# Patient Record
Sex: Female | Born: 1965 | Race: Black or African American | Hispanic: No | Marital: Married | Smoking: Never smoker
Health system: Southern US, Community
[De-identification: ages and names within clinical notes are randomized; demographics above are authoritative.]

## PROBLEM LIST (undated history)

## (undated) DIAGNOSIS — J309 Allergic rhinitis, unspecified: Secondary | ICD-10-CM

## (undated) DIAGNOSIS — G43909 Migraine, unspecified, not intractable, without status migrainosus: Secondary | ICD-10-CM

## (undated) DIAGNOSIS — K219 Gastro-esophageal reflux disease without esophagitis: Secondary | ICD-10-CM

## (undated) HISTORY — DX: Gastro-esophageal reflux disease without esophagitis: K21.9

---

## 2006-05-27 HISTORY — PX: ABDOMINAL HYSTERECTOMY: SHX81

## 2008-12-20 ENCOUNTER — Emergency Department (HOSPITAL_COMMUNITY): Admission: EM | Admit: 2008-12-20 | Discharge: 2008-12-20 | Payer: Self-pay | Admitting: Family Medicine

## 2010-09-27 ENCOUNTER — Other Ambulatory Visit: Payer: Self-pay | Admitting: Family Medicine

## 2010-09-27 DIAGNOSIS — Z1231 Encounter for screening mammogram for malignant neoplasm of breast: Secondary | ICD-10-CM

## 2010-10-04 ENCOUNTER — Ambulatory Visit
Admission: RE | Admit: 2010-10-04 | Discharge: 2010-10-04 | Disposition: A | Discharge status: Home / Self Care | Payer: Commercial Managed Care - PPO | Source: Ambulatory Visit | Attending: Family Medicine | Admitting: Family Medicine

## 2010-10-04 DIAGNOSIS — Z1231 Encounter for screening mammogram for malignant neoplasm of breast: Secondary | ICD-10-CM

## 2011-11-07 ENCOUNTER — Encounter (HOSPITAL_COMMUNITY): Payer: Self-pay | Admitting: Emergency Medicine

## 2011-11-07 ENCOUNTER — Emergency Department (HOSPITAL_COMMUNITY)
Admission: EM | Admit: 2011-11-07 | Discharge: 2011-11-07 | Disposition: A | Discharge status: Home / Self Care | Payer: Commercial Managed Care - PPO | Source: Home / Self Care | Attending: Family Medicine | Admitting: Family Medicine

## 2011-11-07 DIAGNOSIS — S61211A Laceration without foreign body of left index finger without damage to nail, initial encounter: Secondary | ICD-10-CM

## 2011-11-07 DIAGNOSIS — S61209A Unspecified open wound of unspecified finger without damage to nail, initial encounter: Secondary | ICD-10-CM

## 2011-11-07 HISTORY — DX: Migraine, unspecified, not intractable, without status migrainosus: G43.909

## 2011-11-07 MED ORDER — BACITRACIN 500 UNIT/GM EX OINT
1.0000 "application " | TOPICAL_OINTMENT | Freq: Once | CUTANEOUS | Status: AC
  Administered 2011-11-07: 1 via TOPICAL

## 2011-11-07 NOTE — ED Provider Notes (Signed)
History     CSN: [id number]  Arrival date & time 11/07/11  1054   First MD Initiated Contact with Patient 11/07/11 1111      Chief Complaint  Patient presents with  . Laceration    (Consider location/radiation/quality/duration/timing/severity/associated sxs/prior treatment) HPI Comments: The patient presents with a small laceration to her left index finger. States she was washing dishes and a bowl broke in her hands. Bleeding controlled. Laceration is approx 2 cm. No numbness or itingling. Dt up to date.   The history is provided by the patient and the spouse.    Past Medical History  Diagnosis Date  . Migraines     Past Surgical History  Procedure Date  . Abdominal hysterectomy     History reviewed. No pertinent family history.  History  Substance Use Topics  . Smoking status: Never Smoker   . Smokeless tobacco: Not on file  . Alcohol Use: No    OB History    Grav Para Term Preterm Abortions TAB SAB Ect Mult Living                  Review of Systems  Constitutional: Negative.   HENT: Negative.   Respiratory: Negative.   Cardiovascular: Negative.     Allergies  Review of patient's allergies indicates no known allergies.  Home Medications  No current outpatient prescriptions on file.  BP 132/91  Pulse 85  Temp 98.4 F (36.9 C) (Oral)  Resp 16  SpO2 99%  Physical Exam  Nursing note and vitals reviewed. Constitutional: She appears well-developed and well-nourished. No distress.  HENT:  Head: Normocephalic.  Cardiovascular: Normal rate and regular rhythm.   Pulmonary/Chest: Effort normal and breath sounds normal.  Musculoskeletal:       The patient has a 2 cm full skin thickness laceration to the medial aspect of his left index finger proximal to the pip joint. N/v intact. Full rom. Bleeding controlled.     ED Course  Procedures (including critical care time)the wound was cleansed with betadine. Local with 2% xylocain. Sterile drape and  technique. Wound explored and no fb found. Closed with 5-0 Ethilon , 2 simple sutures. Good closure and hemostasis. The patient tolerated well. Wound cleansed with sterile saline , antibiotic ointment and dressing applied.  Labs Reviewed - No data to display No results found.   1. Laceration of left index finger w/o foreign body w/o damage to nail       MDM          Randa Spike, MD 11/07/11 1213

## 2011-11-07 NOTE — Discharge Instructions (Signed)
Keep clean and covered. Ok to shower. Apply antibiotic ointment and cover. Follow up in 10 days for suture removal. Follow up sooner if any complications or concerns. May use mederma once healing complete

## 2011-11-07 NOTE — ED Notes (Signed)
Reports tetanus less than 5 years ago.

## 2011-11-07 NOTE — ED Notes (Signed)
Assisted with suture setup

## 2011-11-07 NOTE — ED Notes (Signed)
Laceration to left index finger.  Bleeding controlled at present.  Cut finger washing a glass bowl this am.

## 2011-11-17 ENCOUNTER — Emergency Department (HOSPITAL_COMMUNITY)
Admission: EM | Admit: 2011-11-17 | Discharge: 2011-11-17 | Discharge status: Absent without leave | Payer: Self-pay | Source: Home / Self Care

## 2011-12-30 ENCOUNTER — Other Ambulatory Visit: Payer: Self-pay | Admitting: Family Medicine

## 2011-12-30 DIAGNOSIS — Z1231 Encounter for screening mammogram for malignant neoplasm of breast: Secondary | ICD-10-CM

## 2012-01-06 ENCOUNTER — Ambulatory Visit
Admission: RE | Admit: 2012-01-06 | Discharge: 2012-01-06 | Disposition: A | Discharge status: Home / Self Care | Payer: Commercial Managed Care - PPO | Source: Ambulatory Visit | Attending: Family Medicine | Admitting: Family Medicine

## 2012-01-06 DIAGNOSIS — Z1231 Encounter for screening mammogram for malignant neoplasm of breast: Secondary | ICD-10-CM

## 2012-07-27 ENCOUNTER — Encounter: Payer: Self-pay | Admitting: *Deleted

## 2012-07-30 ENCOUNTER — Encounter: Payer: Self-pay | Admitting: Cardiovascular Disease

## 2012-07-30 ENCOUNTER — Ambulatory Visit (INDEPENDENT_AMBULATORY_CARE_PROVIDER_SITE_OTHER): Payer: Commercial Managed Care - PPO | Admitting: Cardiovascular Disease

## 2012-07-30 ENCOUNTER — Encounter: Payer: Self-pay | Admitting: *Deleted

## 2012-07-30 VITALS — BP 118/72 | HR 76 | Ht 64.0 in | Wt 158.8 lb

## 2012-07-30 DIAGNOSIS — F419 Anxiety disorder, unspecified: Secondary | ICD-10-CM | POA: Insufficient documentation

## 2012-07-30 DIAGNOSIS — R079 Chest pain, unspecified: Secondary | ICD-10-CM

## 2012-07-30 NOTE — Progress Notes (Signed)
Patient ID: Michele Snyder, female   DOB: 23-Jan-1966, 47 y.o.   MRN: [id number] 47 yo referred by Dr Dimas Aguas for chest pain Started in 2023/05/14 when her mother died. Had anxiety attacks.  Pain worse at night and with recumbancy.  Helped with prilosec.  Has tried to change diet. Occasional left sided pain with walking but mostly rest pain.  Anxiety improved but 49 yo son is living with her now going through a divorce with a child.  Compliant with meds and thinks H2 blocker and valium helping No previous history of cardiac issues.    ROS: Denies fever, malais, weight loss, blurry vision, decreased visual acuity, cough, sputum, SOB, hemoptysis, pleuritic pain, palpitaitons, heartburn, abdominal pain, melena, lower extremity edema, claudication, or rash.  All other systems reviewed and negative   General: Affect appropriate Healthy:  appears stated age HEENT: normal Neck supple with no adenopathy JVP normal no bruits no thyromegaly Lungs clear with no wheezing and good diaphragmatic motion Heart:  S1/S2 no murmur,rub, gallop or click PMI normal Abdomen: benighn, BS positve, no tenderness, no AAA no bruit.  No HSM or HJR Distal pulses intact with no bruits No edema Neuro non-focal Skin warm and dry No muscular weakness  Medications Current Outpatient Prescriptions  Medication Sig Dispense Refill  . ALPRAZolam (XANAX) 0.5 MG tablet Take 0.5 mg by mouth as needed for sleep.      Marland Kitchen omeprazole (PRILOSEC) 20 MG capsule Take 20 mg by mouth 2 (two) times daily.      . pseudoephedrine (SUDAFED) 120 MG 12 hr tablet Take 120 mg by mouth as needed for congestion.      . ranitidine (ZANTAC) 150 MG tablet Take 150 mg by mouth 2 (two) times daily.       No current facility-administered medications for this visit.    Allergies Review of patient's allergies indicates no known allergies.  Family History: No family history on file.  Social History: History   Social History  . Marital Status:  Married    Spouse Name: N/A    Number of Children: N/A  . Years of Education: N/A   Occupational History  . Not on file.   Social History Main Topics  . Smoking status: Never Smoker   . Smokeless tobacco: Not on file  . Alcohol Use: No  . Drug Use: No  . Sexually Active: Not on file   Other Topics Concern  . Not on file   Social History Narrative  . No narrative on file    Electrocardiogram:  NSR rate 99 normal ECG  Done 07/28/12 Dr Penni Bombard office  Assessment and Plan

## 2012-07-30 NOTE — Assessment & Plan Note (Signed)
Low carb diet and portion control discussed.  Continue proton pump inhibitor

## 2012-07-30 NOTE — Patient Instructions (Addendum)
Your physician recommends that you schedule a follow-up appointment in: WE WILL CALL YOU WITH RESULTS AND ADVISE NEXT STEPS  Your physician has requested that you have a stress echocardiogram. For further information please visit [email or web address] Please follow instruction sheet as given.

## 2012-07-30 NOTE — Assessment & Plan Note (Signed)
Atypical normal ECG  F/U stress echo

## 2012-07-30 NOTE — Assessment & Plan Note (Signed)
Reactive secondary to mother's death Improving PRN xanax

## 2012-08-13 ENCOUNTER — Other Ambulatory Visit (HOSPITAL_COMMUNITY): Payer: Commercial Managed Care - PPO

## 2012-08-13 ENCOUNTER — Ambulatory Visit (HOSPITAL_COMMUNITY)
Admission: RE | Admit: 2012-08-13 | Discharge: 2012-08-13 | Disposition: A | Discharge status: Home / Self Care | Payer: 59 | Source: Ambulatory Visit | Attending: Cardiovascular Disease | Admitting: Cardiovascular Disease

## 2012-08-13 DIAGNOSIS — R072 Precordial pain: Secondary | ICD-10-CM

## 2012-08-13 DIAGNOSIS — R079 Chest pain, unspecified: Secondary | ICD-10-CM | POA: Insufficient documentation

## 2012-08-13 NOTE — Progress Notes (Signed)
Stress Lab Nurses Notes - Kili Gracy 08/13/2012 Reason for doing test: Chest Pain Type of test: Stress Echo Nurse performing test: Parke Poisson, RN Nuclear Medicine Tech: Not Applicable Echo Tech: Karrie Doffing MD performing test: R. Rothbart & Joni Reining NP Family MD: Dr. Dimas Aguas Test explained and consent signed: yes IV started: No IV started Symptoms: Fatigue Treatment/Intervention: None Reason test stopped: fatigue and reached target HR After recovery IV was: NA Patient to return to Nuc. Med at : NA Patient discharged: Home Patient's Condition upon discharge was: stable Comments: During test peak BP 173/74 & HR 171.  Recovery BP 132/77 & HR 95.  Symptoms resolved in recovery. Erskine Speed T

## 2012-08-13 NOTE — Progress Notes (Signed)
*  PRELIMINARY RESULTS* Echocardiogram Echocardiogram Stress Test has been performed.  Conrad Railroad 08/13/2012, 10:19 AM

## 2012-08-17 ENCOUNTER — Telehealth: Payer: Self-pay | Admitting: Cardiovascular Disease

## 2012-08-17 NOTE — Telephone Encounter (Signed)
Pt rtn call 

## 2012-08-25 NOTE — Telephone Encounter (Signed)
OLD MESSAGE  SEE  STRESS ECHO RESULT NOTE .Zack Seal

## 2012-09-28 ENCOUNTER — Other Ambulatory Visit: Payer: Self-pay | Admitting: Occupational Medicine

## 2012-09-28 ENCOUNTER — Ambulatory Visit: Payer: Self-pay

## 2012-09-28 DIAGNOSIS — R52 Pain, unspecified: Secondary | ICD-10-CM

## 2012-12-08 ENCOUNTER — Encounter (HOSPITAL_COMMUNITY): Payer: Self-pay | Admitting: Cardiology

## 2013-03-15 ENCOUNTER — Other Ambulatory Visit: Payer: Self-pay

## 2013-03-15 DIAGNOSIS — Z1231 Encounter for screening mammogram for malignant neoplasm of breast: Secondary | ICD-10-CM

## 2013-04-12 ENCOUNTER — Ambulatory Visit: Payer: Self-pay

## 2013-05-03 ENCOUNTER — Ambulatory Visit: Payer: Self-pay

## 2013-05-19 ENCOUNTER — Ambulatory Visit
Admission: RE | Admit: 2013-05-19 | Discharge: 2013-05-19 | Disposition: A | Discharge status: Home / Self Care | Payer: 59 | Source: Ambulatory Visit

## 2013-05-19 DIAGNOSIS — Z1231 Encounter for screening mammogram for malignant neoplasm of breast: Secondary | ICD-10-CM

## 2013-11-29 ENCOUNTER — Ambulatory Visit (HOSPITAL_COMMUNITY)
Admission: RE | Admit: 2013-11-29 | Discharge: 2013-11-29 | Disposition: A | Discharge status: Home / Self Care | Payer: 59 | Source: Ambulatory Visit | Attending: Family Medicine | Admitting: Family Medicine

## 2013-11-29 ENCOUNTER — Other Ambulatory Visit (HOSPITAL_COMMUNITY): Payer: Self-pay | Admitting: Family Medicine

## 2013-11-29 DIAGNOSIS — N200 Calculus of kidney: Secondary | ICD-10-CM

## 2013-11-29 DIAGNOSIS — R319 Hematuria, unspecified: Secondary | ICD-10-CM

## 2013-11-29 DIAGNOSIS — R109 Unspecified abdominal pain: Secondary | ICD-10-CM | POA: Insufficient documentation

## 2013-11-29 NOTE — Progress Notes (Signed)
Phoned results and faxed results to Dr Selinda FlavinKevin Howard at 10:30 am . Pt was not in waiting room when phone call was transferred. This was relayed to the Physicians office and if pt was to return to Radiology, patient was to go home. The office was informed that the patient was not present and would inform the patient if she returned.

## 2014-04-28 ENCOUNTER — Emergency Department (HOSPITAL_COMMUNITY)
Admission: EM | Admit: 2014-04-28 | Discharge: 2014-04-28 | Disposition: A | Discharge status: Home / Self Care | Payer: 59 | Source: Home / Self Care | Attending: Emergency Medicine | Admitting: Emergency Medicine

## 2014-04-28 ENCOUNTER — Encounter (HOSPITAL_COMMUNITY): Payer: Self-pay | Admitting: *Deleted

## 2014-04-28 DIAGNOSIS — H1011 Acute atopic conjunctivitis, right eye: Secondary | ICD-10-CM

## 2014-04-28 MED ORDER — OLOPATADINE HCL 0.2 % OP SOLN: OPHTHALMIC | Status: DC

## 2014-04-28 MED ORDER — POLYMYXIN B-TRIMETHOPRIM 10000-0.1 UNIT/ML-% OP SOLN: 1.0000 [drp] | OPHTHALMIC | Status: DC

## 2014-04-28 NOTE — ED Notes (Signed)
Pt  Reports  Symptoms   Of         Of  Eye  Drainage   And   Matted   together        Since  yest   dens  any  Injury  denys  Any  Pain      The r  Eye is slightly  reddned   And  Reports  It is  irritated

## 2014-04-28 NOTE — Discharge Instructions (Signed)
Eye Drops Use eye drops as directed. It may be easier to have someone help you put the drops in your eye. If you are alone, use the following instructions to help you.  Wash your hands before putting drops in your eyes.  Read the label and look at your medication. Check for any expiration date that may appear on the bottle or tube. Changes of color may be a warning that the medication is old or ineffective. This is especially true if the medication has become brown in color. If you have questions or concerns, call your caregiver. DROPS  Tilt your head back with the affected eye uppermost. Gently pull down on your lower lid. Do not pull up on the upper lid.  Look up. Place the dropper or bottle just over the edge of the lower lid near the white portion at the bottom of the eye. The goal is to have the drop go into the little sac formed by the lower lid and the bottom of the eye itself. Do not release the drop from a height of several inches over the eye. That will only serve to startle the person receiving the medicine when it lands and forces a blink.  Steady your hand in a comfortable manner. An example would be to hold the dropper or bottle between your thumb and index (pointing) finger. Lean your index finger against the brow.  Then, slowly and gently squeeze one drop of medication into your eye.  Once the medication has been applied, place your finger between the lower eyelid and the nose, pressing firmly against the nose for 5-10 seconds. This will slow the process of the eye drop entering the small canal that normally drains tears into the nose, and therefore increases the exposure of the medicine to the eye for a few extra seconds. OINTMENTS  Look up. Place the tip of the tube just over the edge of the lower lid near the white portion at the bottom of the eye. The goal is to create a line of ointment along the inner surface of the eyelid in the little sac formed by the lower lid and the  bottom of the eye itself.  Avoid touching the tube tip to your eyeball or eyelid. This avoids contamination of the tube or the medicine in the tube.  Once a line of medicine has been created, hold the upper lid up and look down before releasing the upper lid. This will force the ointment to spread over the surface of the eye.  Your vision will be very blurry for a few minutes after applying an ointment properly. This is normal and will clear as you continue to blink. For this reason, it is best to apply ointments just before going to sleep, or at a time when you can rest your eyes for 5-10 minutes after applying the medication. GENERAL  Store your medicine in a cool, dry place after each use.  If you need a second medication, wait at least two minutes. This helps the first medication to be taken up (absorbed) by the eye.  If you have been instructed to use both an eye drop and an eye ointment, always apply the drop first and then the ointment 3-4 minutes afterward. Never put medications into the eye unless the label reads, "For Ophthalmic Use," "For Use In Eyes" or "Eye Drops." If you have questions, call your caregiver. Document Released: 08/19/2000 Document Revised: 09/27/2013 Document Reviewed: 10/25/2008 ExitCare Patient Information 2015 [employer]. This   information is not intended to replace advice given to you by your health care provider. Make sure you discuss any questions you have with your health care provider.  Allergic Conjunctivitis The conjunctiva is a thin membrane that covers the visible white part of the eyeball and the underside of the eyelids. This membrane protects and lubricates the eye. The membrane has small blood vessels running through it that can normally be seen. When the conjunctiva becomes inflamed, the condition is called conjunctivitis. In response to the inflammation, the conjunctival blood vessels become swollen. The swelling results in redness in the normally  white part of the eye. The blood vessels of this membrane also react when a person has allergies and is then called allergic conjunctivitis. This condition usually lasts for as long as the allergy persists. Allergic conjunctivitis cannot be passed to another person (non-contagious). The likelihood of bacterial infection is great and the cause is not likely due to allergies if the inflamed eye has:  A sticky discharge.  Discharge or sticking together of the lids in the morning.  Scaling or flaking of the eyelids where the eyelashes come out.  Red swollen eyelids. CAUSES   Viruses.  Irritants such as foreign bodies.  Chemicals.  General allergic reactions.  Inflammation or serious diseases in the inside or the outside of the eye or the orbit (the boney cavity in which the eye sits) can cause a "red eye." SYMPTOMS   Eye redness.  Tearing.  Itchy eyes.  Burning feeling in the eyes.  Clear drainage from the eye.  Allergic reaction due to pollens or ragweed sensitivity. Seasonal allergic conjunctivitis is frequent in the spring when pollens are in the air and in the fall. DIAGNOSIS  This condition, in its many forms, is usually diagnosed based on the history and an ophthalmological exam. It usually involves both eyes. If your eyes react at the same time every year, allergies may be the cause. While most "red eyes" are due to allergy or an infection, the role of an eye (ophthalmological) exam is important. The exam can rule out serious diseases of the eye or orbit. TREATMENT   Non-antibiotic eye drops, ointments, or medications by mouth may be prescribed if the ophthalmologist is sure the conjunctivitis is due to allergies alone.  Over-the-counter drops and ointments for allergic symptoms should be used only after other causes of conjunctivitis have been ruled out, or as your caregiver suggests. Medications by mouth are often prescribed if other allergy-related symptoms are present.  If the ophthalmologist is sure that the conjunctivitis is due to allergies alone, treatment is normally limited to drops or ointments to reduce itching and burning. HOME CARE INSTRUCTIONS   Wash hands before and after applying drops or ointments, or touching the inflamed eye(s) or eyelids.  Do not let the eye dropper tip or ointment tube touch the eyelid when putting medicine in your eye.  Stop using your soft contact lenses and throw them away. Use a new pair of lenses when recovery is complete. You should run through sterilizing cycles at least three times before use after complete recovery if the old soft contact lenses are to be used. Hard contact lenses should be stopped. They need to be thoroughly sterilized before use after recovery.  Itching and burning eyes due to allergies is often relieved by using a cool cloth applied to closed eye(s). SEEK MEDICAL CARE IF:   Your problems do not go away after two or three days of treatment.  Your  lids are sticky (especially in the morning when you wake up) or stick together.  Discharge develops. Antibiotics may be needed either as drops, ointment, or by mouth.  You have extreme light sensitivity.  An oral temperature above 102 F (38.9 C) develops.  Pain in or around the eye or any other visual symptom develops. MAKE SURE YOU:   Understand these instructions.  Will watch your condition.  Will get help right away if you are not doing well or get worse. Document Released: 08/03/2002 Document Revised: 08/05/2011 Document Reviewed: 06/29/2007 [facility] Patient Information 2015 [place], [place]. This information is not intended to replace advice given to you by your health care provider. Make sure you discuss any questions you have with your health care provider.

## 2014-04-28 NOTE — ED Provider Notes (Signed)
CSN: [id number]     Arrival date & time 04/28/14  [id number] History   First MD Initiated Contact with Patient 04/28/14 0827     Chief Complaint  Patient presents with  . Eye Problem   (Consider location/radiation/quality/duration/timing/severity/associated sxs/prior Treatment) HPI           48 year old female presents for evaluation of possible pinkeye. She has itching in her right eye and there was crusting when she woke up this morning. She notes that last night the eye feels slightly dry and she used an eye drop in the right eye but this is the first time she had increased that sort of lubricating eyedrop. This morning the eye was pink and crusted shut. It is still itchy, not painful or tender. Her vision is unaffected. No systemic symptoms.  Past Medical History  Diagnosis Date  . Migraines   . GERD (gastroesophageal reflux disease)    Past Surgical History  Procedure Laterality Date  . Abdominal hysterectomy  2008   History reviewed. No pertinent family history. History  Substance Use Topics  . Smoking status: Never Smoker   . Smokeless tobacco: Not on file  . Alcohol Use: No   OB History    No data available     Review of Systems  Eyes: Positive for discharge, redness and itching. Negative for photophobia, pain and visual disturbance.  All other systems reviewed and are negative.   Allergies  Review of patient's allergies indicates no known allergies.  Home Medications   Prior to Admission medications   Medication Sig Start Date End Date Taking? Authorizing Provider  ALPRAZolam Prudy Feeler(XANAX) 0.5 MG tablet Take 0.5 mg by mouth as needed for sleep.    Historical Provider, MD  Olopatadine HCl (PATADAY) 0.2 % SOLN 1 drop per eye once daily as needed for redness, itching, or irritation 04/28/14   Graylon GoodZachary H Aster Eckrich, PA-C  omeprazole (PRILOSEC) 20 MG capsule Take 20 mg by mouth 2 (two) times daily.    Historical Provider, MD  pseudoephedrine (SUDAFED) 120 MG 12 hr tablet Take 120 mg by  mouth as needed for congestion.    Historical Provider, MD  ranitidine (ZANTAC) 150 MG tablet Take 150 mg by mouth 2 (two) times daily.    Historical Provider, MD  trimethoprim-polymyxin b (POLYTRIM) ophthalmic solution Place 1 drop into the right eye every 3 (three) hours. While awake, up to 6 doses per day 04/28/14   Graylon GoodZachary H Emmanuelle Hibbitts, PA-C   BP 142/93 mmHg  Pulse 80  Temp(Src) 98.4 F (36.9 C) (Oral)  Resp 14  SpO2 100% Physical Exam  Constitutional: She is oriented to person, place, and time. Vital signs are normal. She appears well-developed and well-nourished. No distress.  HENT:  Head: Normocephalic and atraumatic.  Eyes: EOM are normal. Pupils are equal, round, and reactive to light. Right eye exhibits no discharge. Left eye exhibits no discharge. No scleral icterus.  The right eye exhibits very slight amount of conjunctival injection around the periphery of the conjunctiva only. The eyes otherwise normal  Pulmonary/Chest: Effort normal. No respiratory distress.  Neurological: She is alert and oriented to person, place, and time. She has normal strength. Coordination normal.  Skin: Skin is warm and dry. No rash noted. She is not diaphoretic.  Psychiatric: She has a normal mood and affect. Judgment normal.  Nursing note and vitals reviewed.   ED Course  Procedures (including critical care time) Labs Review Labs Reviewed - No data to display  Imaging Review No results  found.   MDM   1. Allergic conjunctivitis, right    Very early conjunctivitis. This is either allergic, viral, or early bacterial. It is difficult to tell at this point. I suspect that she simply has a local irritation allergic reaction to the drops that she used last night. We'll treat with Pataday drops once daily as needed for now and will give a postdated prescription for Polytrim to take if worsening. Follow-up when necessary   Meds ordered this encounter  Medications  . Olopatadine HCl (PATADAY) 0.2 %  SOLN    Sig: 1 drop per eye once daily as needed for redness, itching, or irritation    Dispense:  2.5 mL    Refill:  0    Order Specific Question:  Supervising Provider    Answer:  Linna HoffKINDL, JAMES D [phone][[id number]]  . trimethoprim-polymyxin b (POLYTRIM) ophthalmic solution    Sig: Place 1 drop into the right eye every 3 (three) hours. While awake, up to 6 doses per day    Dispense:  10 mL    Refill:  0    Order Specific Question:  Supervising Provider    Answer:  Bradd CanaryKINDL, JAMES D [[id number]]       Graylon GoodZachary H Natlie Asfour, PA-C 04/28/14 [phone]

## 2014-05-31 ENCOUNTER — Other Ambulatory Visit: Payer: Self-pay

## 2014-05-31 DIAGNOSIS — Z1231 Encounter for screening mammogram for malignant neoplasm of breast: Secondary | ICD-10-CM

## 2014-06-03 ENCOUNTER — Encounter (INDEPENDENT_AMBULATORY_CARE_PROVIDER_SITE_OTHER): Payer: Self-pay

## 2014-06-03 ENCOUNTER — Ambulatory Visit
Admission: RE | Admit: 2014-06-03 | Discharge: 2014-06-03 | Disposition: A | Discharge status: Home / Self Care | Payer: 59 | Source: Ambulatory Visit

## 2014-06-03 DIAGNOSIS — Z1231 Encounter for screening mammogram for malignant neoplasm of breast: Secondary | ICD-10-CM

## 2015-06-15 DIAGNOSIS — G5603 Carpal tunnel syndrome, bilateral upper limbs: Secondary | ICD-10-CM | POA: Diagnosis not present

## 2015-06-19 DIAGNOSIS — G5603 Carpal tunnel syndrome, bilateral upper limbs: Secondary | ICD-10-CM | POA: Diagnosis not present

## 2015-06-21 ENCOUNTER — Other Ambulatory Visit: Payer: Self-pay | Admitting: Orthopedic Surgery

## 2015-06-30 DIAGNOSIS — H5203 Hypermetropia, bilateral: Secondary | ICD-10-CM | POA: Diagnosis not present

## 2015-06-30 DIAGNOSIS — H524 Presbyopia: Secondary | ICD-10-CM | POA: Diagnosis not present

## 2015-06-30 DIAGNOSIS — H52223 Regular astigmatism, bilateral: Secondary | ICD-10-CM | POA: Diagnosis not present

## 2015-07-12 ENCOUNTER — Encounter (HOSPITAL_BASED_OUTPATIENT_CLINIC_OR_DEPARTMENT_OTHER): Payer: Self-pay | Admitting: *Deleted

## 2015-07-13 ENCOUNTER — Other Ambulatory Visit: Payer: Self-pay

## 2015-07-13 DIAGNOSIS — Z0289 Encounter for other administrative examinations: Secondary | ICD-10-CM | POA: Diagnosis not present

## 2015-07-13 DIAGNOSIS — Z1231 Encounter for screening mammogram for malignant neoplasm of breast: Secondary | ICD-10-CM

## 2015-07-20 ENCOUNTER — Ambulatory Visit (HOSPITAL_BASED_OUTPATIENT_CLINIC_OR_DEPARTMENT_OTHER): Payer: 59 | Admitting: Certified Registered"

## 2015-07-20 ENCOUNTER — Encounter (HOSPITAL_BASED_OUTPATIENT_CLINIC_OR_DEPARTMENT_OTHER)
Admission: RE | Disposition: A | Discharge status: Home / Self Care | Payer: Self-pay | Source: Ambulatory Visit | Attending: Orthopedic Surgery

## 2015-07-20 ENCOUNTER — Encounter (HOSPITAL_BASED_OUTPATIENT_CLINIC_OR_DEPARTMENT_OTHER): Payer: Self-pay | Admitting: Certified Registered"

## 2015-07-20 ENCOUNTER — Ambulatory Visit (HOSPITAL_BASED_OUTPATIENT_CLINIC_OR_DEPARTMENT_OTHER)
Admission: RE | Admit: 2015-07-20 | Discharge: 2015-07-20 | Disposition: A | Discharge status: Home / Self Care | Payer: 59 | Source: Ambulatory Visit | Attending: Orthopedic Surgery | Admitting: Orthopedic Surgery

## 2015-07-20 DIAGNOSIS — K219 Gastro-esophageal reflux disease without esophagitis: Secondary | ICD-10-CM | POA: Diagnosis not present

## 2015-07-20 DIAGNOSIS — G5601 Carpal tunnel syndrome, right upper limb: Secondary | ICD-10-CM | POA: Diagnosis not present

## 2015-07-20 HISTORY — PX: CARPAL TUNNEL RELEASE: SHX101

## 2015-07-20 HISTORY — DX: Allergic rhinitis, unspecified: J30.9

## 2015-07-20 SURGERY — CARPAL TUNNEL RELEASE
Anesthesia: Monitor Anesthesia Care | Site: Wrist | Laterality: Right

## 2015-07-20 MED ORDER — BUPIVACAINE HCL (PF) 0.25 % IJ SOLN
INTRAMUSCULAR | Status: DC | PRN
  Administered 2015-07-20: 9 mL

## 2015-07-20 MED ORDER — HYDROCODONE-ACETAMINOPHEN 5-325 MG PO TABS: 1.0000 | ORAL_TABLET | Freq: Four times a day (QID) | ORAL | Status: DC | PRN

## 2015-07-20 MED ORDER — CEFAZOLIN SODIUM-DEXTROSE 2-3 GM-% IV SOLR
2.0000 g | INTRAVENOUS | Status: AC
  Administered 2015-07-20: 2 g via INTRAVENOUS

## 2015-07-20 MED ORDER — FENTANYL CITRATE (PF) 100 MCG/2ML IJ SOLN
INTRAMUSCULAR | Status: AC
  Filled 2015-07-20: qty 2

## 2015-07-20 MED ORDER — ONDANSETRON HCL 4 MG/2ML IJ SOLN
INTRAMUSCULAR | Status: AC
  Filled 2015-07-20: qty 2

## 2015-07-20 MED ORDER — CEFAZOLIN SODIUM-DEXTROSE 2-3 GM-% IV SOLR
INTRAVENOUS | Status: AC
  Filled 2015-07-20: qty 50

## 2015-07-20 MED ORDER — LIDOCAINE HCL (PF) 0.5 % IJ SOLN
INTRAMUSCULAR | Status: DC | PRN
  Administered 2015-07-20: 30 mL via INTRAVENOUS

## 2015-07-20 MED ORDER — SCOPOLAMINE 1 MG/3DAYS TD PT72
1.0000 | MEDICATED_PATCH | Freq: Once | TRANSDERMAL | Status: DC | PRN
Start: 2015-07-20 — End: 2015-07-20

## 2015-07-20 MED ORDER — ONDANSETRON HCL 4 MG/2ML IJ SOLN
INTRAMUSCULAR | Status: DC | PRN
  Administered 2015-07-20: 4 mg via INTRAVENOUS

## 2015-07-20 MED ORDER — FENTANYL CITRATE (PF) 100 MCG/2ML IJ SOLN
50.0000 ug | INTRAMUSCULAR | Status: DC | PRN
  Administered 2015-07-20: 50 ug via INTRAVENOUS

## 2015-07-20 MED ORDER — FENTANYL CITRATE (PF) 100 MCG/2ML IJ SOLN: 25.0000 ug | INTRAMUSCULAR | Status: DC | PRN

## 2015-07-20 MED ORDER — LIDOCAINE HCL (CARDIAC) 20 MG/ML IV SOLN
INTRAVENOUS | Status: AC
Start: 2015-07-20 — End: 2015-07-20
  Filled 2015-07-20: qty 5

## 2015-07-20 MED ORDER — BUPIVACAINE HCL (PF) 0.25 % IJ SOLN
INTRAMUSCULAR | Status: AC
  Filled 2015-07-20: qty 30

## 2015-07-20 MED ORDER — MIDAZOLAM HCL 2 MG/2ML IJ SOLN
1.0000 mg | INTRAMUSCULAR | Status: DC | PRN
  Administered 2015-07-20: 2 mg via INTRAVENOUS

## 2015-07-20 MED ORDER — ONDANSETRON HCL 4 MG/2ML IJ SOLN: 4.0000 mg | Freq: Once | INTRAMUSCULAR | Status: DC | PRN

## 2015-07-20 MED ORDER — LACTATED RINGERS IV SOLN
INTRAVENOUS | Status: DC
  Administered 2015-07-20: 08:00:00 via INTRAVENOUS

## 2015-07-20 MED ORDER — GLYCOPYRROLATE 0.2 MG/ML IJ SOLN: 0.2000 mg | Freq: Once | INTRAMUSCULAR | Status: DC | PRN

## 2015-07-20 MED ORDER — PROPOFOL 10 MG/ML IV BOLUS
INTRAVENOUS | Status: DC | PRN
  Administered 2015-07-20 (×2): 20 mg via INTRAVENOUS

## 2015-07-20 MED ORDER — MIDAZOLAM HCL 2 MG/2ML IJ SOLN
INTRAMUSCULAR | Status: AC
  Filled 2015-07-20: qty 2

## 2015-07-20 MED ORDER — CHLORHEXIDINE GLUCONATE 4 % EX LIQD
60.0000 mL | Freq: Once | CUTANEOUS | Status: DC
Start: 2015-07-20 — End: 2015-07-20

## 2015-07-20 MED FILL — HYDROCODON-APAP 5-325: 5-325 | 8 days supply | Qty: 30 | Fill #0

## 2015-07-20 SURGICAL SUPPLY — 35 items
BLADE SURG 15 STRL LF DISP TIS (BLADE) ×1 IMPLANT
BLADE SURG 15 STRL SS (BLADE) ×2
BNDG COHESIVE 3X5 TAN STRL LF (GAUZE/BANDAGES/DRESSINGS) ×3 IMPLANT
BNDG ESMARK 4X9 LF (GAUZE/BANDAGES/DRESSINGS) IMPLANT
BNDG GAUZE ELAST 4 BULKY (GAUZE/BANDAGES/DRESSINGS) ×3 IMPLANT
CHLORAPREP W/TINT 26ML (MISCELLANEOUS) ×3 IMPLANT
CORDS BIPOLAR (ELECTRODE) ×3 IMPLANT
COVER BACK TABLE 60X90IN (DRAPES) ×3 IMPLANT
COVER MAYO STAND STRL (DRAPES) ×3 IMPLANT
CUFF TOURNIQUET SINGLE 18IN (TOURNIQUET CUFF) ×3 IMPLANT
DRAPE EXTREMITY T 121X128X90 (DRAPE) ×3 IMPLANT
DRAPE SURG 17X23 STRL (DRAPES) ×3 IMPLANT
DRSG PAD ABDOMINAL 8X10 ST (GAUZE/BANDAGES/DRESSINGS) ×3 IMPLANT
GAUZE SPONGE 4X4 12PLY STRL (GAUZE/BANDAGES/DRESSINGS) ×3 IMPLANT
GAUZE XEROFORM 1X8 LF (GAUZE/BANDAGES/DRESSINGS) ×3 IMPLANT
GLOVE BIO SURGEON STRL SZ 6.5 (GLOVE) ×2 IMPLANT
GLOVE BIO SURGEONS STRL SZ 6.5 (GLOVE) ×1
GLOVE BIOGEL PI IND STRL 7.0 (GLOVE) ×2 IMPLANT
GLOVE BIOGEL PI IND STRL 8.5 (GLOVE) ×1 IMPLANT
GLOVE BIOGEL PI INDICATOR 7.0 (GLOVE) ×4
GLOVE BIOGEL PI INDICATOR 8.5 (GLOVE) ×2
GLOVE SURG ORTHO 8.0 STRL STRW (GLOVE) ×3 IMPLANT
GOWN STRL REUS W/ TWL LRG LVL3 (GOWN DISPOSABLE) ×1 IMPLANT
GOWN STRL REUS W/TWL LRG LVL3 (GOWN DISPOSABLE) ×2
GOWN STRL REUS W/TWL XL LVL3 (GOWN DISPOSABLE) ×3 IMPLANT
NEEDLE PRECISIONGLIDE 27X1.5 (NEEDLE) ×3 IMPLANT
NS IRRIG 1000ML POUR BTL (IV SOLUTION) ×3 IMPLANT
PACK BASIN DAY SURGERY FS (CUSTOM PROCEDURE TRAY) ×3 IMPLANT
STOCKINETTE 4X48 STRL (DRAPES) ×3 IMPLANT
SUT ETHILON 4 0 PS 2 18 (SUTURE) ×3 IMPLANT
SUT VICRYL 4-0 PS2 18IN ABS (SUTURE) IMPLANT
SYR BULB 3OZ (MISCELLANEOUS) ×3 IMPLANT
SYR CONTROL 10ML LL (SYRINGE) ×3 IMPLANT
TOWEL OR 17X24 6PK STRL BLUE (TOWEL DISPOSABLE) ×3 IMPLANT
UNDERPAD 30X30 (UNDERPADS AND DIAPERS) ×3 IMPLANT

## 2015-07-20 NOTE — Op Note (Signed)
Dictation Number [id number]

## 2015-07-20 NOTE — H&P (Signed)
  Michele Snyder is an 50 y.o. female.   Chief Complaint:numbness right hand HPI: Ms Michele Snyder is 50 yo female with numbness both hands for months. Nerve conduction tests reveal bilateral carpal tunnel syndrome. She prefers to proceed with surgical intervention rather than injections.  Past Medical History  Diagnosis Date  . Migraines   . GERD (gastroesophageal reflux disease)   . Allergic rhinitis     Past Surgical History  Procedure Laterality Date  . Abdominal hysterectomy  2008    History reviewed. No pertinent family history. Social History:  reports that she has never smoked. She does not have any smokeless tobacco history on file. She reports that she does not drink alcohol or use illicit drugs.  Allergies: No Known Allergies  No prescriptions prior to admission    No results found for this or any previous visit (from the past 48 hour(s)).  No results found.   Pertinent items noted in HPI and remainder of comprehensive ROS otherwise negative.  Height  (1.651 m), weight 74.39 kg (164 lb).  General appearance: alert, cooperative and appears stated age Head: Normocephalic, without obvious abnormality Neck: no JVD Resp: clear to auscultation bilaterally Cardio: regular rate and rhythm, S1, S2 normal, no murmur, click, rub or gallop GI: soft, non-tender; bowel sounds normal; no masses,  no organomegaly Extremities: numbness right hand Pulses: 2+ and symmetric Skin: Skin color, texture, turgor normal. No rashes or lesions Neurologic: Grossly normal Incision/Wound: na  Assessment/Plan Diagnosis: Carpal tunnel syndrome right hand Plan: carpal tunnel release right hand Pre, peri and postoperative course were discussed along with the risks and complications.  The patient is aware there is no guarantee with the surgery, possibility of infection, recurrence, injury to arteries, nerves, tendons, incomplete relief of symptoms and dystrophy.    Michele Snyder  R 07/20/2015, 6:23 AM

## 2015-07-20 NOTE — Anesthesia Postprocedure Evaluation (Signed)
Anesthesia Post Note  Patient: Michele Snyder  Procedure(s) Performed: Procedure(s) (LRB): RIGHT CARPAL TUNNEL RELEASE (Right)  Patient location during evaluation: PACU Anesthesia Type: MAC and Bier Block Level of consciousness: awake and alert Pain management: pain level controlled Vital Signs Assessment: post-procedure vital signs reviewed and stable Respiratory status: spontaneous breathing, nonlabored ventilation and respiratory function stable Cardiovascular status: stable and blood pressure returned to baseline Anesthetic complications: no    Last Vitals:  Filed Vitals:   07/20/15 0900 07/20/15 0915  BP: 121/79 129/73  Pulse: 57 54  Temp: 36.7 C   Resp: 16 16    Last Pain: There were no vitals filed for this visit.               Reino Kent

## 2015-07-20 NOTE — Brief Op Note (Signed)
07/20/2015  9:07 AM  PATIENT:  Michele Snyder  50 y.o. female  PRE-OPERATIVE DIAGNOSIS:  RIGHT CARPAL TUNNEL SYNDROME  POST-OPERATIVE DIAGNOSIS:  RIGHT CARPAL TUNNEL SYNDROME  PROCEDURE:  Procedure(s): RIGHT CARPAL TUNNEL RELEASE (Right)  SURGEON:  Surgeon(s) and Role:    * Cindee Salt, MD - Primary  PHYSICIAN ASSISTANT:   ASSISTANTS: none   ANESTHESIA:   local and regional  EBL:  Total I/O In: 500 [I.V.:500] Out: -   BLOOD ADMINISTERED:none  DRAINS: none   LOCAL MEDICATIONS USED:  BUPIVICAINE   SPECIMEN:  No Specimen  DISPOSITION OF SPECIMEN:  N/A  COUNTS:  YES  TOURNIQUET:   Total Tourniquet Time Documented: Forearm (Right) - 20 minutes Total: Forearm (Right) - 20 minutes   DICTATION: .Other Dictation: Dictation Number [id number]  PLAN OF CARE: Discharge to home after PACU  PATIENT DISPOSITION:  PACU - hemodynamically stable.

## 2015-07-20 NOTE — Op Note (Signed)
NAMESHENAYA, LEBO NO.:  [id number]  MEDICAL RECORD NO.:  [id number]  LOCATION:                                 FACILITY:  PHYSICIAN:  Cindee Salt, M.D.            DATE OF BIRTH:  DATE OF PROCEDURE:  07/20/2015 DATE OF DISCHARGE:                              OPERATIVE REPORT   PREOPERATIVE DIAGNOSIS:  Carpal tunnel syndrome, right hand.  POSTOPERATIVE DIAGNOSIS:  Carpal tunnel syndrome, right hand.  OPERATION:  Decompression of right median nerve.  SURGEON:  Cindee Salt, M.D.  ASSISTANT:  None.  ANESTHESIA:  Forearm IV regional with local infiltration.  HISTORY:  The patient is a 50 year old female with a history of carpal tunnel syndrome, nerve conduction is positive.  She has elected not to undergo any injections and has elected to undergo surgical release of the median nerve, right hand.  Pre, peri, and postoperative course have been discussed along with risks and complications.  She is aware that there was no guarantee with the surgery; possibility of infection; recurrence of injury to arteries, nerves, tendons; incomplete relief of symptoms; dystrophy.  In the preoperative area, the patient is seen, the extremity marked by both patient and surgeon, and antibiotic given.  DESCRIPTION OF PROCEDURE:  The patient was brought to the operating room, where a forearm-based IV regional anesthetic was carried out without difficulty.  She was prepped using ChloraPrep, supine position with the right arm free.  A 3-minute dry time was allowed.  Time-out taken, confirming the patient and procedure.  A longitudinal incision was made in the right palm, carried down through the subcutaneous tissue.  Bleeders were electrocauterized.  The palmar fascia was split. Superficial palmar arch identified.  The flexor tendon to the ring and little finger identified to the ulnar side of the median nerve.  The carpal retinaculum was incised with sharp dissection.  Right angle  and Sewall retractor were placed between the skin and forearm fascia.  The fascia was released for approximately a 1.5 cm to 2 cm proximal to the wrist crease under direct vision.  The canal was explored.  Persistent median artery was present.  An area of compression to the nerve was apparent. Motor branch entered into muscle distally.  The wound was irrigated and closed with interrupted 4-0 nylon sutures.  Local infiltration with 0.25% bupivacaine without epinephrine was given, 9 mL was used.  A sterile compressive dressing with the fingers free was applied.  On deflation of the tourniquet, all fingers were immediately pinked.  She was taken to the recovery room for observation in satisfactory condition.  She will be discharged to home to return to the [facility] in 1 week, on [place].          ______________________________ Cindee Salt, M.D.     GK/MEDQ  D:  07/20/2015  T:  07/20/2015  Job:  [id number]

## 2015-07-20 NOTE — Transfer of Care (Signed)
Immediate Anesthesia Transfer of Care Note  Patient: Michele Snyder  Procedure(s) Performed: Procedure(s): RIGHT CARPAL TUNNEL RELEASE (Right)  Patient Location: PACU  Anesthesia Type:Bier block  Level of Consciousness: awake, alert , oriented and patient cooperative  Airway & Oxygen Therapy: Patient Spontanous Breathing and Patient connected to face mask oxygen  Post-op Assessment: Report given to RN and Post -op Vital signs reviewed and stable  Post vital signs: Reviewed and stable  Last Vitals:  Filed Vitals:   07/20/15 0742  BP: 134/79  Pulse: 68  Temp: 36.7 C  Resp: 18    Complications: No apparent anesthesia complications

## 2015-07-20 NOTE — Anesthesia Preprocedure Evaluation (Addendum)
Anesthesia Evaluation  Patient identified by MRN, date of birth, ID band Patient awake    Reviewed: Allergy & Precautions, NPO status , Patient's Chart, lab work & pertinent test results  Airway Mallampati: I  TM Distance: >3 FB Neck ROM: Full    Dental no notable dental hx. (+) Teeth Intact   Pulmonary    Pulmonary exam normal breath sounds clear to auscultation       Cardiovascular  Rhythm:Regular Rate:Normal     Neuro/Psych  Headaches, PSYCHIATRIC DISORDERS Anxiety    GI/Hepatic GERD  Medicated,  Endo/Other    Renal/GU      Musculoskeletal   Abdominal   Peds  Hematology   Anesthesia Other Findings   Reproductive/Obstetrics                           Anesthesia Physical Anesthesia Plan  ASA: II  Anesthesia Plan: MAC and Bier Block   Post-op Pain Management:    Induction: Intravenous  Airway Management Planned: Simple Face Mask  Additional Equipment:   Intra-op Plan:   Post-operative Plan:   Informed Consent: I have reviewed the patients History and Physical, chart, labs and discussed the procedure including the risks, benefits and alternatives for the proposed anesthesia with the patient or authorized representative who has indicated his/her understanding and acceptance.   Dental advisory given  Plan Discussed with: Anesthesiologist, CRNA and Surgeon  Anesthesia Plan Comments:         Anesthesia Quick Evaluation

## 2015-07-20 NOTE — Discharge Instructions (Addendum)

## 2015-07-20 NOTE — Anesthesia Procedure Notes (Addendum)
Procedure Name: MAC Date/Time: 07/20/2015 8:39 AM Performed by: Stefana Lodico D Pre-anesthesia Checklist: Patient identified, Emergency Drugs available, Suction available, Patient being monitored and Timeout performed Patient Re-evaluated:Patient Re-evaluated prior to inductionOxygen Delivery Method: Simple face mask   Anesthesia Regional Block:  Bier block (IV Regional)  Pre-Anesthetic Checklist: ,, timeout performed, Correct Patient, Correct Site, Correct Laterality, Correct Procedure,, site marked, surgical consent,, at surgeon's request Needles:  Injection technique: Single-shot  Needle Type: Other      Needle Gauge: 20 and 20 G    Additional Needles: Bier block (IV Regional) Narrative:   Performed by: Personally

## 2015-07-21 ENCOUNTER — Encounter (HOSPITAL_BASED_OUTPATIENT_CLINIC_OR_DEPARTMENT_OTHER): Payer: Self-pay | Admitting: Orthopedic Surgery

## 2015-07-27 ENCOUNTER — Ambulatory Visit: Payer: Self-pay

## 2015-08-08 ENCOUNTER — Ambulatory Visit
Admission: RE | Admit: 2015-08-08 | Discharge: 2015-08-08 | Disposition: A | Discharge status: Home / Self Care | Payer: 59 | Source: Ambulatory Visit

## 2015-08-08 DIAGNOSIS — Z1231 Encounter for screening mammogram for malignant neoplasm of breast: Secondary | ICD-10-CM | POA: Diagnosis not present

## 2015-10-11 DIAGNOSIS — M65312 Trigger thumb, left thumb: Secondary | ICD-10-CM | POA: Diagnosis not present

## 2015-10-11 DIAGNOSIS — Z4889 Encounter for other specified surgical aftercare: Secondary | ICD-10-CM | POA: Diagnosis not present

## 2015-10-17 DIAGNOSIS — Z0000 Encounter for general adult medical examination without abnormal findings: Secondary | ICD-10-CM | POA: Diagnosis not present

## 2015-11-02 DIAGNOSIS — N951 Menopausal and female climacteric states: Secondary | ICD-10-CM | POA: Diagnosis not present

## 2015-11-02 DIAGNOSIS — Z0000 Encounter for general adult medical examination without abnormal findings: Secondary | ICD-10-CM | POA: Diagnosis not present

## 2015-11-02 DIAGNOSIS — Z1389 Encounter for screening for other disorder: Secondary | ICD-10-CM | POA: Diagnosis not present

## 2015-11-02 DIAGNOSIS — M1990 Unspecified osteoarthritis, unspecified site: Secondary | ICD-10-CM | POA: Diagnosis not present

## 2015-11-15 DIAGNOSIS — M65312 Trigger thumb, left thumb: Secondary | ICD-10-CM | POA: Diagnosis not present

## 2015-11-15 DIAGNOSIS — G5603 Carpal tunnel syndrome, bilateral upper limbs: Secondary | ICD-10-CM | POA: Diagnosis not present

## 2015-12-19 DIAGNOSIS — S199XXA Unspecified injury of neck, initial encounter: Secondary | ICD-10-CM | POA: Diagnosis not present

## 2015-12-19 DIAGNOSIS — M79671 Pain in right foot: Secondary | ICD-10-CM | POA: Diagnosis not present

## 2015-12-19 DIAGNOSIS — S301XXA Contusion of abdominal wall, initial encounter: Secondary | ICD-10-CM | POA: Diagnosis not present

## 2015-12-19 DIAGNOSIS — R109 Unspecified abdominal pain: Secondary | ICD-10-CM | POA: Diagnosis not present

## 2015-12-19 DIAGNOSIS — S0990XA Unspecified injury of head, initial encounter: Secondary | ICD-10-CM | POA: Diagnosis not present

## 2015-12-19 DIAGNOSIS — S9031XA Contusion of right foot, initial encounter: Secondary | ICD-10-CM | POA: Diagnosis not present

## 2015-12-19 DIAGNOSIS — S20219A Contusion of unspecified front wall of thorax, initial encounter: Secondary | ICD-10-CM | POA: Diagnosis not present

## 2015-12-20 MED FILL — POLYETHYLENE GLYCOL 3350 PO: 30 days supply | Qty: 527 | Fill #0

## 2016-01-13 DIAGNOSIS — R079 Chest pain, unspecified: Secondary | ICD-10-CM | POA: Diagnosis not present

## 2016-01-13 DIAGNOSIS — R0602 Shortness of breath: Secondary | ICD-10-CM | POA: Diagnosis not present

## 2016-01-25 ENCOUNTER — Encounter: Payer: 59 | Attending: Family Medicine | Admitting: Nutrition

## 2016-01-25 VITALS — Ht 65.0 in | Wt 169.0 lb

## 2016-01-25 DIAGNOSIS — Z713 Dietary counseling and surveillance: Secondary | ICD-10-CM | POA: Insufficient documentation

## 2016-01-25 DIAGNOSIS — E669 Obesity, unspecified: Secondary | ICD-10-CM

## 2016-01-25 NOTE — Patient Instructions (Signed)
Goals 1. Follow My Plate 2.Exercise  30 minutes 4 days per week. 3. Drink only water and plain coffee 4. Increase fresh fruits and  Vegetables.  5. Lose 1 lb per week 6. Cut out snacks unless veggies or protein.
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
## 2016-01-30 MED FILL — POLYETHYLENE GLYCOL 3350 PO: 30 days supply | Qty: 527 | Fill #1

## 2016-02-17 DIAGNOSIS — R29810 Facial weakness: Secondary | ICD-10-CM | POA: Diagnosis not present

## 2016-02-17 DIAGNOSIS — I10 Essential (primary) hypertension: Secondary | ICD-10-CM | POA: Diagnosis not present

## 2016-02-17 DIAGNOSIS — I639 Cerebral infarction, unspecified: Secondary | ICD-10-CM | POA: Diagnosis not present

## 2016-02-17 DIAGNOSIS — I69831 Monoplegia of upper limb following other cerebrovascular disease affecting right dominant side: Secondary | ICD-10-CM | POA: Diagnosis not present

## 2016-04-03 MED FILL — POLYETHYLENE GLYCOL 3350 PO: 30 days supply | Qty: 527 | Fill #2

## 2016-04-22 ENCOUNTER — Encounter: Payer: 59 | Attending: Family Medicine | Admitting: Nutrition

## 2016-04-22 ENCOUNTER — Encounter: Payer: Self-pay | Admitting: Nutrition

## 2016-04-22 VITALS — Ht 65.0 in | Wt 162.0 lb

## 2016-04-22 DIAGNOSIS — E669 Obesity, unspecified: Secondary | ICD-10-CM

## 2016-04-22 DIAGNOSIS — Z713 Dietary counseling and surveillance: Secondary | ICD-10-CM | POA: Diagnosis not present

## 2016-04-22 NOTE — Patient Instructions (Signed)
Goals Increase fiber foods  Increase water to 84 oz per day Continue to walk 40-60 minutes daily Lose 1-2 lbs per week Consider  Healthy Choice or Smart ones if desired for meals Take protein bars for snacks if not able to eat meals on time.

## 2016-04-22 NOTE — Progress Notes (Signed)
  Medical Nutrition Therapy:  Appt start time: 1530 end time:  1630.   Assessment:  Primary concerns today: Overweight. [employer]  Her husband has had 3 strokes.  Has been trying to stay on track with her eating habits going back and forth from hospital. Lost 7 lbs since last visit.  Feels better.   Working on balancing meals with visiting husband in the hospital.  Preferred Learning Style:   No preference indicated    Learning Readiness:     Ready  Change in progress   MEDICATIONS:    DIETARY INTAKE:  24-hr recall:  B ( AM): yogurt  OSnk ( AM):   L ( PM): Salad with protein, water Snk ( PM): misc  D ( PM): Meat, veggies, water Snk ( PM):  Beverages: Water,  Usual physical activity: walks  Estimated energy needs: [phone] calories 135  g carbohydrates 90 g protein 33 g fat  Progress Towards Goal(s):  In progress.   Nutritional Diagnosis:  NI-1.5 Excessive energy intake As related to overweight.  As evidenced by excessive calorie intake that exceeds her needs.    Intervention:  My Plate, portion sizes, meal planning, healthy snacks, benefits of exercise. .  Goals Increase fiber foods  Increase water to 84 oz per day Continue to walk 40-60 minutes daily Lose 1-2 lbs per week Consider  Healthy Choice or Smart ones if desired for meals Take protein bars for snacks if not able to eat meals on time.    Teaching Method Utilized:  Visual Auditory Hands on  Handouts given during visit include:  The Plate Method   Meal Plan Card   Barriers to learning/adherence to lifestyle change:  None  Demonstrated degree of understanding via:  Teach Back   Monitoring/Evaluation:  Dietary intake, exercise, meal planning, and body weight in 6 month(s).

## 2016-06-18 MED FILL — POLYETHYLENE GLYCOL 3350 PO: 30 days supply | Qty: 527 | Fill #3

## 2016-07-19 MED FILL — POLYETHYLENE GLYCOL 3350 PO: 90 days supply | Qty: 1581 | Fill #4

## 2016-08-10 DIAGNOSIS — H5203 Hypermetropia, bilateral: Secondary | ICD-10-CM | POA: Diagnosis not present

## 2016-08-10 DIAGNOSIS — H524 Presbyopia: Secondary | ICD-10-CM | POA: Diagnosis not present

## 2016-09-23 ENCOUNTER — Telehealth: Payer: Self-pay | Admitting: Nutrition

## 2016-09-23 NOTE — Telephone Encounter (Signed)
VM to call and reschedule appt from [facility] Monday.

## 2016-10-21 ENCOUNTER — Ambulatory Visit: Payer: Self-pay | Admitting: Nutrition

## 2016-11-13 DIAGNOSIS — Z0000 Encounter for general adult medical examination without abnormal findings: Secondary | ICD-10-CM | POA: Diagnosis not present

## 2016-11-13 DIAGNOSIS — N951 Menopausal and female climacteric states: Secondary | ICD-10-CM | POA: Diagnosis not present

## 2016-11-13 DIAGNOSIS — M1990 Unspecified osteoarthritis, unspecified site: Secondary | ICD-10-CM | POA: Diagnosis not present

## 2016-11-15 DIAGNOSIS — Z1389 Encounter for screening for other disorder: Secondary | ICD-10-CM | POA: Diagnosis not present

## 2016-11-15 DIAGNOSIS — Z0000 Encounter for general adult medical examination without abnormal findings: Secondary | ICD-10-CM | POA: Diagnosis not present

## 2016-11-15 DIAGNOSIS — N951 Menopausal and female climacteric states: Secondary | ICD-10-CM | POA: Diagnosis not present

## 2016-11-15 DIAGNOSIS — M1990 Unspecified osteoarthritis, unspecified site: Secondary | ICD-10-CM | POA: Diagnosis not present

## 2016-11-15 DIAGNOSIS — Z6826 Body mass index (BMI) 26.0-26.9, adult: Secondary | ICD-10-CM | POA: Diagnosis not present

## 2016-11-29 MED FILL — POLYETHYLENE GLYCOL 3350 PO: 90 days supply | Qty: 1581 | Fill #5

## 2016-12-30 ENCOUNTER — Other Ambulatory Visit: Payer: Self-pay | Admitting: Family Medicine

## 2016-12-30 DIAGNOSIS — Z1231 Encounter for screening mammogram for malignant neoplasm of breast: Secondary | ICD-10-CM

## 2017-01-07 ENCOUNTER — Ambulatory Visit: Payer: Self-pay

## 2017-01-10 ENCOUNTER — Ambulatory Visit
Admission: RE | Admit: 2017-01-10 | Discharge: 2017-01-10 | Disposition: A | Discharge status: Home / Self Care | Payer: 59 | Source: Ambulatory Visit | Attending: Family Medicine | Admitting: Family Medicine

## 2017-01-10 DIAGNOSIS — Z1231 Encounter for screening mammogram for malignant neoplasm of breast: Secondary | ICD-10-CM

## 2017-01-13 ENCOUNTER — Ambulatory Visit: Payer: Self-pay

## 2017-01-13 ENCOUNTER — Other Ambulatory Visit: Payer: Self-pay | Admitting: Occupational Medicine

## 2017-01-13 DIAGNOSIS — M79642 Pain in left hand: Secondary | ICD-10-CM

## 2017-02-03 ENCOUNTER — Ambulatory Visit: Payer: 59 | Admitting: Occupational Therapy

## 2017-02-03 ENCOUNTER — Ambulatory Visit: Payer: PRIVATE HEALTH INSURANCE | Attending: Occupational Medicine | Admitting: Occupational Therapy

## 2017-02-03 DIAGNOSIS — M25642 Stiffness of left hand, not elsewhere classified: Secondary | ICD-10-CM | POA: Diagnosis present

## 2017-02-03 DIAGNOSIS — M25542 Pain in joints of left hand: Secondary | ICD-10-CM | POA: Insufficient documentation

## 2017-02-03 DIAGNOSIS — M6281 Muscle weakness (generalized): Secondary | ICD-10-CM | POA: Diagnosis present

## 2017-02-03 NOTE — Patient Instructions (Signed)
  Flexor Tendon Gliding (Active Hook Fist)   With fingers and knuckles straight, bend middle and tip joints. Do NOT bend large knuckles. Repeat _10___ times. Do _6___ sessions per day.  AROM: PIP Flexion / Extension    Pinch bottom knuckle of ___long finger, then ring finger of right hand to prevent bending. Actively bend middle knuckle until stretch is felt. Hold __5__ seconds. Relax. Straighten finger as far as possible. Repeat _10___ times per set.  Do __6__ sessions per day.  DIP Flexion (Active Blocked)    Hold __long finger, then ring finger firmly at the middle so that only the tip joint can bend. Hold __5__ seconds. Repeat __10__ times. Do _6___ sessions per day.     PIP / DIP Composite Flexion (Passive Stretch)    Use other hand to bend middle and tip joints of _long finger, then ring finger. Hold _10___ seconds. Repeat _5___ times. Do __6__ sessions per day.

## 2017-02-03 NOTE — Therapy (Signed)
[facility] [address] [place], [place], [address] Phone: [phone]   Fax:  [phone]  Occupational Therapy Evaluation  Patient Details  Name: Michele Snyder MRN: [id number] Date of Birth: [birth year] Referring Provider: Dr. Lucia Gaskins  Encounter Date: 02/03/2017      OT End of Session - 02/03/17 1422    Visit Number 1   Number of Visits 12   Date for OT Re-Evaluation 03/17/17   Authorization Type [place] [employer]   Authorization Time Period approved 6 visits - will need to request more prn   Authorization - Visit Number 1   Authorization - Number of Visits 6   OT Start Time 1325   OT Stop Time 1410   OT Time Calculation (min) 45 min   Activity Tolerance Patient tolerated treatment well      Past Medical History:  Diagnosis Date  . Allergic rhinitis   . GERD (gastroesophageal reflux disease)   . Migraines     Past Surgical History:  Procedure Laterality Date  . ABDOMINAL HYSTERECTOMY  2008  . CARPAL TUNNEL RELEASE Right 07/20/2015   Procedure: RIGHT CARPAL TUNNEL RELEASE;  Surgeon: Cindee Salt, MD;  Location: [facility];  Service: Orthopedics;  Laterality: Right;    There were no vitals filed for this visit.      Subjective Assessment - 02/03/17 1332    Subjective  I hurt the tip joint   Pertinent History Rt CTR 2017, Lt CTS   Patient Stated Goals get my finger better and get back to my regular job   Currently in Pain? Yes   Pain Score --  0/10 at rest, but pain depending on movement/task fluctuates   Pain Location Finger (Comment which one)  4th finger   Pain Orientation Left   Pain Descriptors / Indicators Sharp   Pain Type Acute pain   Pain Onset 1 to 4 weeks ago   Pain Frequency Intermittent   Aggravating Factors  dependent on task    Pain Relieving Factors rest           [facility] OT Assessment - 02/03/17 0001      Assessment   Diagnosis Sprain Lt ring  finger  at DIP joint per pt report   Referring Provider Dr. Lucia Gaskins   Onset Date 01/12/17   Assessment Xray negative for fracture (see in EPIC). Tendon appears intact. Most likely ligament involvement   Prior Therapy none     Precautions   Precaution Comments light duty     Restrictions   Weight Bearing Restrictions No     Balance Screen   Has the patient fallen in the past 6 months No   Has the patient had a decrease in activity level because of a fear of falling?  No   Is the patient reluctant to leave their home because of a fear of falling?  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 Rt hand WNL's. Lt thumb, index, and small finger WFL's. See below for Lt long and ring finger     Left Hand AROM   L Long PIP 0-100 90 Degrees   L Long DIP 0-70 60 Degrees   L Ring PIP 0-100 90 Degrees   L Ring DIP 0-70 50 Degrees     Hand Function   Right Hand Grip (lbs) 75   Right Hand Lateral Pinch 14 lbs   Right Hand 3 Point Pinch 12.5 lbs   Left Hand Grip (lbs) 30     Left Hand Lateral Pinch 14 lbs   Left 3 point pinch 11 lbs                  OT Treatments/Exercises  (OP) - 02/03/17 0001      Exercises   Exercises Hand     Hand Exercises   Other Hand Exercises see pt instructions for details on ROM HEP     Splinting   Splinting Pt issued pre-fab distal phalanx splint to immobolize/protect DIP joint and instructed to wear at work for next 2 weeks. Pt can remove for hygiene purposes and to perform ex's. Pt also does not need to wear at home.                OT Education - 02/03/17 1421    Education provided Yes   Education Details HEP, splint wear and care   Person(s) Educated Patient   Methods Explanation;Demonstration;Handout   Comprehension Verbalized understanding;Returned demonstration          OT Short Term Goals - 02/03/17 1426      OT SHORT TERM GOAL #1   Title Independent with initial HEP    Time 3   Period Weeks   Status On-going   Target Date 02/24/17     OT SHORT TERM GOAL #2   Title Independent with splint wear and care prn   Time 3   Period Weeks   Status On-going     OT SHORT TERM GOAL #3   Title Pt reports overall decreased pain with ROM to Lt ring finger   Time 3   Period Weeks   Status New           OT Long Term Goals - 02/03/17 1427      OT LONG TERM GOAL #1   Title Independent with updated HEP   Time 6   Period Weeks   Status New   Target Date 03/17/17     OT LONG TERM GOAL #2   Title Pt to improve Lt long and ring finger PIP motion by 10 degrees   Baseline 90* for both   Time 6   Period Weeks   Status New     OT LONG TERM GOAL #3   Title Pt to improve Lt long and ring DIP motion by 10 degrees or more   Baseline Lt long = 60, ring = 50  Time 6   Period Weeks   Status New     OT LONG TERM GOAL #4   Title Pt to improve grip strength Lt hand by 20 lbs or more to assist in work related tasks   Baseline eval = 30 lbs (Rt = 75 lbs)                Plan - 02/03/17 1423    Clinical Impression Statement Pt is a 51 y.o. female who presents to outpatient rehab s/p Lt ring finger  sprain on 01/12/17 at work. Pt reports donning glove and hyperextending DIP joint of ring finger. Pt now presents with decreased ROM, decreased grip strength, and pain Lt ring finger. Pt also appears stiff in Lt long finger as a result.    Occupational Profile and client history currently impacting functional performance current limitations affecting pt's ability to work in regular duties as pt is currently on light duty   Occupational performance deficits (Please refer to evaluation for details): IADL's;Work;Leisure   Rehab Potential Excellent   OT Frequency 2x / week   OT Duration 6 weeks   OT Treatment/Interventions Self-care/ADL training;Moist Heat;Fluidtherapy;DME and/or AE instruction;Splinting;Patient/family education;Compression bandaging;Therapeutic exercises;Therapeutic activities;Ultrasound;Cryotherapy;Passive range of motion;Parrafin;Electrical Stimulation;Manual Therapy   Plan fluidotherapy, review HEP, continue ROM as tolerated   Clinical Decision Making Limited treatment options, no task modification necessary   Consulted and Agree with Plan of Care Patient      Patient will benefit from skilled therapeutic intervention in order to improve the following deficits and impairments:  Decreased range of motion, Impaired flexibility, Impaired sensation, Impaired UE functional use, Pain, Decreased strength  Visit Diagnosis: Stiffness of left hand, not elsewhere classified - Plan: Ot plan of care cert/re-cert  Pain in joint of left hand - Plan: Ot plan of care cert/re-cert  Muscle weakness (generalized) - Plan: Ot plan of care cert/re-cert    Problem List Patient Active Problem List   Diagnosis Date Noted  . GERD (gastroesophageal reflux disease) 07/30/2012  . Anxiety 07/30/2012  . Chest pain 07/30/2012    Kelli ChurnBallie, Alexys Gassett Johnson, OTR/L 02/03/2017, 2:33 PM  [facility] [address] [place], [place], [address] Phone:  [phone]   Fax:  [phone]  Name: Michele Snyder MRN: [id number] Date of Birth: [birth year]

## 2017-02-05 ENCOUNTER — Ambulatory Visit: Payer: PRIVATE HEALTH INSURANCE | Attending: Occupational Medicine | Admitting: Occupational Therapy

## 2017-02-05 DIAGNOSIS — M25642 Stiffness of left hand, not elsewhere classified: Secondary | ICD-10-CM | POA: Insufficient documentation

## 2017-02-05 DIAGNOSIS — M25542 Pain in joints of left hand: Secondary | ICD-10-CM | POA: Insufficient documentation

## 2017-02-05 NOTE — Therapy (Signed)
[facility] [address] [place], [place], [address] Phone: [phone]   Fax:  [phone]  Occupational Therapy Treatment  Patient Details  Name: Michele Snyder MRN: [id number] Date of Birth: 06/21/1965 Referring Provider: Dr. Dannial Monarch  Encounter Date: 02/05/2017      OT End of Session - 02/05/17 0930    Visit Number 2   Number of Visits 12   Date for OT Re-Evaluation 03/17/17   Authorization Type [place] [employer]   Authorization Time Period approved 6 visits - will need to request more prn   Authorization - Visit Number 2   Authorization - Number of Visits 6   OT Start Time 0845   OT Stop Time 0925   OT Time Calculation (min) 40 min   Activity Tolerance Patient tolerated treatment well      Past Medical History:  Diagnosis Date  . Allergic rhinitis   . GERD (gastroesophageal reflux disease)   . Migraines     Past Surgical History:  Procedure Laterality Date  . ABDOMINAL HYSTERECTOMY  2008  . CARPAL TUNNEL RELEASE Right 07/20/2015   Procedure: RIGHT CARPAL TUNNEL RELEASE;  Surgeon: Daryll Brod, MD;  Location: [facility];  Service: Orthopedics;  Laterality: Right;    There were no vitals filed for this visit.      Subjective Assessment - 02/05/17 0850    Subjective  It's just pain when I try to lift anything or someone grabs my finger   Pertinent History Rt CTR 2017, Lt CTS   Patient Stated Goals get my finger better and get back to my regular job   Currently in Pain? No/denies                      OT Treatments/Exercises (OP) - 02/05/17 0001      ADLs   ADL Comments Therapist noted mildly increased edema today at Lt ring DIP joint and also read latest MD report which stated pt already had splint and MD recommended completely d/c splint and just wearing silicone compression sleeve. Therapist recommended going back to silicone compression sleeve to assist with  swelling, and only where DIP splint if doing any lifting.  Also education provided re: ligaments role in joint stabalization and pt shown diagram of middle and distal phalanges and where collateral ligaments are.      Exercises   Exercises Hand     Hand Exercises   Other Hand Exercises Reviewed HEP - performed each x 10 reps. Pt cautioned to go through entire ROM as tolerated as pt was stopping prior to her full flexion and extension.      Modalities   Modalities Paraffin     LUE Paraffin   Number Minutes Paraffin 10 Minutes   LUE Paraffin Location Hand   Comments at beginning of session to decr. stiffness                  OT Short Term Goals - 02/05/17 1148      OT SHORT TERM GOAL #1   Title Independent with initial HEP    Time 3   Period Weeks   Status Achieved     OT SHORT TERM GOAL #2   Title Independent with splint wear and care prn   Time 3   Period Weeks   Status Achieved     OT SHORT TERM GOAL #3   Title Pt reports overall decreased pain with ROM  to Lt ring finger   Time 3   Period Weeks   Status On-going           OT Long Term Goals - 02/03/17 1427      OT LONG TERM GOAL #1   Title Independent with updated HEP   Time 6   Period Weeks   Status New   Target Date 03/17/17     OT LONG TERM GOAL #2   Title Pt to improve Lt long and ring finger PIP motion by 10 degrees   Baseline 90* for both   Time 6   Period Weeks   Status New     OT LONG TERM GOAL #3   Title Pt to improve Lt long and ring DIP motion by 10 degrees or more   Baseline Lt long = 60, ring = 50   Time 6   Period Weeks   Status New     OT LONG TERM GOAL #4   Title Pt to improve grip strength Lt hand by 20 lbs or more to assist in work related tasks   Baseline eval = 30 lbs (Rt = 75 lbs)                Plan - 02/05/17 1148    Clinical Impression Statement Pt met 2 STG's. Pt progressing towards remaining STG. Pt with slightly increased edema today at DIP joint and  instructed to resume wearing silicone compression sleeve, and only wear DIP splint while lifting at work   Rehab Potential Excellent   OT Frequency 2x / week   OT Duration 6 weeks   OT Treatment/Interventions Self-care/ADL training;Moist Heat;Fluidtherapy;DME and/or AE instruction;Splinting;Patient/family education;Compression bandaging;Therapeutic exercises;Therapeutic activities;Ultrasound;Cryotherapy;Passive range of motion;Parrafin;Electrical Stimulation;Manual Therapy   Plan fluidotherapy, continue A/ROM and P/ROM   Consulted and Agree with Plan of Care Patient      Patient will benefit from skilled therapeutic intervention in order to improve the following deficits and impairments:  Decreased range of motion, Impaired flexibility, Impaired sensation, Impaired UE functional use, Pain, Decreased strength  Visit Diagnosis: Stiffness of left hand, not elsewhere classified  Pain in joint of left hand    Problem List Patient Active Problem List   Diagnosis Date Noted  . GERD (gastroesophageal reflux disease) 07/30/2012  . Anxiety 07/30/2012  . Chest pain 07/30/2012    Carey Bullocks, OTR/L 02/05/2017, 11:51 AM  [facility] [address] [place], [place], [address] Phone: [phone]   Fax:  [phone]  Name: Michele Snyder MRN: [id number] Date of Birth: 02-08-1966

## 2017-02-10 ENCOUNTER — Ambulatory Visit: Payer: PRIVATE HEALTH INSURANCE | Admitting: Occupational Therapy

## 2017-02-10 DIAGNOSIS — M25542 Pain in joints of left hand: Secondary | ICD-10-CM

## 2017-02-10 DIAGNOSIS — M25642 Stiffness of left hand, not elsewhere classified: Secondary | ICD-10-CM

## 2017-02-10 NOTE — Therapy (Signed)
[facility] [address] [place], [place], [address] Phone: [phone]   Fax:  [phone]  Occupational Therapy Treatment  Patient Details  Name: Michele Snyder MRN: [id number] Date of Birth: 10/26/1965 Referring Provider: Dr. Dannial Monarch  Encounter Date: 02/10/2017      OT End of Session - 02/10/17 0923    Visit Number 3   Number of Visits 12   Date for OT Re-Evaluation 03/17/17   Authorization Type [place] [employer]   Authorization Time Period approved 6 visits - will need to request more prn   Authorization - Visit Number 3   Authorization - Number of Visits 6   OT Start Time 0845   OT Stop Time 0925   OT Time Calculation (min) 40 min   Activity Tolerance Patient tolerated treatment well      Past Medical History:  Diagnosis Date  . Allergic rhinitis   . GERD (gastroesophageal reflux disease)   . Migraines     Past Surgical History:  Procedure Laterality Date  . ABDOMINAL HYSTERECTOMY  2008  . CARPAL TUNNEL RELEASE Right 07/20/2015   Procedure: RIGHT CARPAL TUNNEL RELEASE;  Surgeon: Daryll Brod, MD;  Location: [facility];  Service: Orthopedics;  Laterality: Right;    There were no vitals filed for this visit.      Subjective Assessment - 02/10/17 0853    Subjective  No pain, but soreness after session 1-2/10 at Lt ring DIP joint   Pertinent History Rt CTR 2017, Lt CTS   Patient Stated Goals get my finger better and get back to my regular job   Currently in Pain? No/denies                      OT Treatments/Exercises (OP) - 02/10/17 0001      Hand Exercises   Other Hand Exercises Performed blocking ex's for Lt ring PIP joint, then DIP joint. Followed by composite IP flexion Lt ring finger in A/ROM and P/ROM   Other Hand Exercises Pen rolling ex and in hand manipulation activity with coins to facilitate IP flexion Lt ring finger     Modalities   Modalities  Fluidotherapy     LUE Fluidotherapy   Number Minutes Fluidotherapy 12 Minutes   LUE Fluidotherapy Location Hand   Comments at beginning of session to decr. stiffness                  OT Short Term Goals - 02/10/17 0924      OT SHORT TERM GOAL #1   Title Independent with initial HEP    Time 3   Period Weeks   Status Achieved     OT SHORT TERM GOAL #2   Title Independent with splint wear and care prn   Time 3   Period Weeks   Status Achieved     OT SHORT TERM GOAL #3   Title Pt reports overall decreased pain with ROM to Lt ring finger   Time 3   Period Weeks   Status Achieved           OT Long Term Goals - 02/03/17 1427      OT LONG TERM GOAL #1   Title Independent with updated HEP   Time 6   Period Weeks   Status New   Target Date 03/17/17     OT LONG TERM GOAL #2   Title Pt to improve Lt long and  ring finger PIP motion by 10 degrees   Baseline 90* for both   Time 6   Period Weeks   Status New     OT LONG TERM GOAL #3   Title Pt to improve Lt long and ring DIP motion by 10 degrees or more   Baseline Lt long = 60, ring = 50   Time 6   Period Weeks   Status New     OT LONG TERM GOAL #4   Title Pt to improve grip strength Lt hand by 20 lbs or more to assist in work related tasks   Baseline eval = 30 lbs (Rt = 75 lbs)                Plan - 02/10/17 0924    Clinical Impression Statement Pt has met all STG's and has improved ROM Lt ring finger.    Rehab Potential Excellent   OT Frequency 2x / week   OT Duration 6 weeks   OT Treatment/Interventions Self-care/ADL training;Moist Heat;Fluidtherapy;DME and/or AE instruction;Splinting;Patient/family education;Compression bandaging;Therapeutic exercises;Therapeutic activities;Ultrasound;Cryotherapy;Passive range of motion;Parrafin;Electrical Stimulation;Manual Therapy   Plan fluidotherapy, continue ROM, begin strengthening next week   Consulted and Agree with Plan of Care Patient       Patient will benefit from skilled therapeutic intervention in order to improve the following deficits and impairments:  Decreased range of motion, Impaired flexibility, Impaired sensation, Impaired UE functional use, Pain, Decreased strength  Visit Diagnosis: Stiffness of left hand, not elsewhere classified  Pain in joint of left hand    Problem List Patient Active Problem List   Diagnosis Date Noted  . GERD (gastroesophageal reflux disease) 07/30/2012  . Anxiety 07/30/2012  . Chest pain 07/30/2012    Carey Bullocks, OTR/L 02/10/2017, 9:32 AM  [facility] [address] [place], [place], [address] Phone: [phone]   Fax:  [phone]  Name: Michele Snyder MRN: [id number] Date of Birth: 09/08/1965

## 2017-02-12 ENCOUNTER — Encounter: Payer: Self-pay | Admitting: Occupational Therapy

## 2017-02-13 ENCOUNTER — Ambulatory Visit: Payer: PRIVATE HEALTH INSURANCE | Admitting: Occupational Therapy

## 2017-02-13 DIAGNOSIS — M25642 Stiffness of left hand, not elsewhere classified: Secondary | ICD-10-CM

## 2017-02-13 DIAGNOSIS — M25542 Pain in joints of left hand: Secondary | ICD-10-CM

## 2017-02-13 NOTE — Therapy (Signed)
[facility] [address] [place], [place], [address] Phone: [phone]   Fax:  [phone]  Occupational Therapy Treatment  Patient Details  Name: Michele Snyder MRN: [id number] Date of Birth: 1965/06/04 Referring Provider: Dr. Dannial Monarch  Encounter Date: 02/13/2017      OT End of Session - 02/13/17 1057    Visit Number 4   Number of Visits 12   Date for OT Re-Evaluation 03/17/17   Authorization Type [place] [employer]   Authorization Time Period approved 6 visits - will need to request more prn   Authorization - Visit Number 4   Authorization - Number of Visits 6   OT Start Time 1015   OT Stop Time 1058   OT Time Calculation (min) 43 min   Activity Tolerance Patient tolerated treatment well      Past Medical History:  Diagnosis Date  . Allergic rhinitis   . GERD (gastroesophageal reflux disease)   . Migraines     Past Surgical History:  Procedure Laterality Date  . ABDOMINAL HYSTERECTOMY  2008  . CARPAL TUNNEL RELEASE Right 07/20/2015   Procedure: RIGHT CARPAL TUNNEL RELEASE;  Surgeon: Daryll Brod, MD;  Location: [facility];  Service: Orthopedics;  Laterality: Right;    There were no vitals filed for this visit.      Subjective Assessment - 02/13/17 1014    Subjective  I'm getting a little more movement now   Pertinent History Rt CTR 2017, Lt CTS   Patient Stated Goals get my finger better and get back to my regular job   Currently in Pain? No/denies  however did have pain during session only with P/ROM at ring DIP joint   Pain Location --  ring DIP joint   Pain Orientation Left   Pain Descriptors / Indicators Sore;Tender;Aching   Pain Frequency Intermittent   Aggravating Factors  with P/ROM only   Pain Relieving Factors rest            [facility] OT Assessment - 02/13/17 0001      Left Hand AROM   L Long PIP 0-100 98 Degrees   L Ring PIP 0-100 100 Degrees   L Ring  DIP 0-70 57 Degrees                  OT Treatments/Exercises (OP) - 02/13/17 0001      Hand Exercises   Other Hand Exercises Continued to perform blocking ex's for Lt ring PIP joint, then DIP joint. Followed by composite IP flexion Lt ring finger in A/ROM and P/ROM   Other Hand Exercises Picking up pennies b/t thumb and ring finger for DIP motion. Continued in hand manipulation with coins for PIP/DIP flexion     LUE Fluidotherapy   Number Minutes Fluidotherapy 12 Minutes   LUE Fluidotherapy Location Hand   Comments at beginning of session to decr. stiffness     Splinting   Splinting Pt issued elastic splint for IP passive flexion Rt ring finger, but instructed to only wear for 30 sec. to 1 minute at a time, and gradually build up to 3 min. at a time, once about every hour. Pt cautioned to stop if increased pain                OT Education - 02/13/17 1222    Education provided Yes   Education Details elastic splint wear and care   Person(s) Educated Patient   Methods Explanation;Demonstration  Comprehension Verbalized understanding          OT Short Term Goals - 02/10/17 0924      OT SHORT TERM GOAL #1   Title Independent with initial HEP    Time 3   Period Weeks   Status Achieved     OT SHORT TERM GOAL #2   Title Independent with splint wear and care prn   Time 3   Period Weeks   Status Achieved     OT SHORT TERM GOAL #3   Title Pt reports overall decreased pain with ROM to Lt ring finger   Time 3   Period Weeks   Status Achieved           OT Long Term Goals - 02/13/17 1043      OT LONG TERM GOAL #1   Title Independent with updated HEP   Time 6   Period Weeks   Status New     OT LONG TERM GOAL #2   Title Pt to improve Lt long and ring finger PIP motion by 10 degrees   Baseline 90* for both   Time 6   Period Weeks   Status Partially Met  02/13/17: Lt long = 98*, Lt ring 100*     OT LONG TERM GOAL #3   Title Pt to improve Lt  ring DIP motion by 10 degrees or more   Baseline Lt long = 60, ring = 50   Time 6   Period Weeks   Status Revised  02/13/17: Lt ring = 57*     OT LONG TERM GOAL #4   Title Pt to improve grip strength Lt hand by 20 lbs or more to assist in work related tasks   Baseline eval = 30 lbs (Rt = 75 lbs)                Plan - 02/13/17 1218    Clinical Impression Statement Pt progressing with ROM in Lt long and ring finger. Pt had increased pain today with P/ROM.    Rehab Potential Excellent   OT Frequency 2x / week   OT Duration 6 weeks   OT Treatment/Interventions Self-care/ADL training;Moist Heat;Fluidtherapy;DME and/or AE instruction;Splinting;Patient/family education;Compression bandaging;Therapeutic exercises;Therapeutic activities;Ultrasound;Cryotherapy;Passive range of motion;Parrafin;Electrical Stimulation;Manual Therapy   Plan continue fluido or paraffin, begin strengthening and issue putty HEP    Consulted and Agree with Plan of Care Patient      Patient will benefit from skilled therapeutic intervention in order to improve the following deficits and impairments:  Decreased range of motion, Impaired flexibility, Impaired sensation, Impaired UE functional use, Pain, Decreased strength  Visit Diagnosis: Stiffness of left hand, not elsewhere classified  Pain in joint of left hand    Problem List Patient Active Problem List   Diagnosis Date Noted  . GERD (gastroesophageal reflux disease) 07/30/2012  . Anxiety 07/30/2012  . Chest pain 07/30/2012    Carey Bullocks, OTR/L 02/13/2017, 12:23 PM  [facility] [address] [place], [place], [address] Phone: [phone]   Fax:  [phone]  Name: Michele Snyder MRN: [id number] Date of Birth: Dec 17, 1965

## 2017-02-17 ENCOUNTER — Ambulatory Visit: Payer: PRIVATE HEALTH INSURANCE | Attending: Occupational Medicine | Admitting: Occupational Therapy

## 2017-02-17 DIAGNOSIS — M6281 Muscle weakness (generalized): Secondary | ICD-10-CM | POA: Diagnosis present

## 2017-02-17 DIAGNOSIS — M25642 Stiffness of left hand, not elsewhere classified: Secondary | ICD-10-CM | POA: Insufficient documentation

## 2017-02-17 DIAGNOSIS — M25542 Pain in joints of left hand: Secondary | ICD-10-CM | POA: Insufficient documentation

## 2017-02-17 NOTE — Therapy (Signed)
[facility] [address] [place], [place], [address] Phone: [phone]   Fax:  [phone]  Occupational Therapy Treatment  Patient Details  Name: Michele Snyder MRN: [id number] Date of Birth: [birth year] Referring Provider: Dr. Dannial Monarch  Encounter Date: 02/17/2017      OT End of Session - 02/17/17 0923    Visit Number 5   Number of Visits 12   Date for OT Re-Evaluation 03/17/17   Authorization Type [place] [employer]   Authorization Time Period approved 6 visits - will need to request more prn   Authorization - Visit Number 5   Authorization - Number of Visits 6   OT Start Time 0845   OT Stop Time 0930   OT Time Calculation (min) 45 min   Activity Tolerance Patient tolerated treatment well      Past Medical History:  Diagnosis Date  . Allergic rhinitis   . GERD (gastroesophageal reflux disease)   . Migraines     Past Surgical History:  Procedure Laterality Date  . ABDOMINAL HYSTERECTOMY  2008  . CARPAL TUNNEL RELEASE Right 07/20/2015   Procedure: RIGHT CARPAL TUNNEL RELEASE;  Surgeon: Daryll Brod, MD;  Location: [facility];  Service: Orthopedics;  Laterality: Right;    There were no vitals filed for this visit.      Subjective Assessment - 02/17/17 0852    Pertinent History Rt CTR 2017, Lt CTS   Patient Stated Goals get my finger better and get back to my regular job   Currently in Pain? Yes   Pain Score 1    Pain Location --  Ring finger tip   Pain Orientation Left   Pain Descriptors / Indicators Tender;Sore   Pain Type Acute pain   Pain Onset More than a month ago   Pain Frequency Intermittent   Aggravating Factors  P/ROM    Pain Relieving Factors rest                      OT Treatments/Exercises (OP) - 02/17/17 0001      Hand Exercises   Other Hand Exercises Continued to perform blocking ex's for Lt ring PIP joint, then DIP joint. Followed by  composite IP flexion Lt ring finger in A/ROM and P/ROM   Other Hand Exercises Pt issued putty HEP with red resistance putty - see pt instructions for details. Gripper set at level 1 resistance to pick up blocks for sustained grip strength Lt hand: pt with min difficulty, short rest breaks, and pain up to 3/10     LUE Paraffin   Number Minutes Paraffin 12 Minutes   LUE Paraffin Location Hand   Comments At beginning of session to decr. stiffness                OT Education - 02/17/17 0912    Education provided Yes   Education Details Putty HEP   Person(s) Educated Patient   Methods Explanation;Demonstration;Handout   Comprehension Verbalized understanding;Returned demonstration          OT Short Term Goals - 02/10/17 0924      OT SHORT TERM GOAL #1   Title Independent with initial HEP    Time 3   Period Weeks   Status Achieved     OT SHORT TERM GOAL #2   Title Independent with splint wear and care prn   Time 3   Period Weeks   Status Achieved  OT SHORT TERM GOAL #3   Title Pt reports overall decreased pain with ROM to Lt ring finger   Time 3   Period Weeks   Status Achieved           OT Long Term Goals - 02/13/17 1043      OT LONG TERM GOAL #1   Title Independent with updated HEP   Time 6   Period Weeks   Status New     OT LONG TERM GOAL #2   Title Pt to improve Lt long and ring finger PIP motion by 10 degrees   Baseline 90* for both   Time 6   Period Weeks   Status Partially Met  02/13/17: Lt long = 98*, Lt ring 100*     OT LONG TERM GOAL #3   Title Pt to improve Lt ring DIP motion by 10 degrees or more   Baseline Lt long = 60, ring = 50   Time 6   Period Weeks   Status Revised  02/13/17: Lt ring = 57*     OT LONG TERM GOAL #4   Title Pt to improve grip strength Lt hand by 20 lbs or more to assist in work related tasks   Baseline eval = 30 lbs (Rt = 75 lbs)                Plan - 02/17/17 0924    Clinical Impression Statement  Initiated strengthening today with slight increased in pain to 3/10 after strengthening ex's.    Rehab Potential Excellent   OT Frequency 2x / week   OT Duration 6 weeks   OT Treatment/Interventions Self-care/ADL training;Moist Heat;Fluidtherapy;DME and/or AE instruction;Splinting;Patient/family education;Compression bandaging;Therapeutic exercises;Therapeutic activities;Ultrasound;Cryotherapy;Passive range of motion;Parrafin;Electrical Stimulation;Manual Therapy   Plan continue paraffin, continue ROM and strengthening, f/u on more WC visits requested   Consulted and Agree with Plan of Care Patient      Patient will benefit from skilled therapeutic intervention in order to improve the following deficits and impairments:  Decreased range of motion, Impaired flexibility, Impaired sensation, Impaired UE functional use, Pain, Decreased strength  Visit Diagnosis: Stiffness of left hand, not elsewhere classified  Pain in joint of left hand  Muscle weakness (generalized)    Problem List Patient Active Problem List   Diagnosis Date Noted  . GERD (gastroesophageal reflux disease) 07/30/2012  . Anxiety 07/30/2012  . Chest pain 07/30/2012    Carey Bullocks, OTR/L 02/17/2017, 9:27 AM  [facility] [address] [place], [place], [address] Phone: [phone]   Fax:  [phone]  Name: YEE GANGI MRN: [id number] Date of Birth: [birth year]

## 2017-02-17 NOTE — Patient Instructions (Signed)
  1. Grip Strengthening (Resistive Putty)   Squeeze putty using thumb and all fingers. Repeat _15___ times. Do __2__ sessions per day.   2. IP Fisting (Resistive Putty)    Keeping knuckles straight, bend fingertips to squeeze putty. Repeat __10__ times. Do __2__ sessions per day.     3. Roll putty into tube on table and pinch between each finger and thumb x 10 reps each. (Do ring and small finger together)

## 2017-02-19 ENCOUNTER — Ambulatory Visit: Payer: PRIVATE HEALTH INSURANCE | Admitting: Occupational Therapy

## 2017-02-19 DIAGNOSIS — M25642 Stiffness of left hand, not elsewhere classified: Secondary | ICD-10-CM

## 2017-02-19 DIAGNOSIS — M6281 Muscle weakness (generalized): Secondary | ICD-10-CM

## 2017-02-19 NOTE — Therapy (Signed)
[facility] [address] [place], [place], [address] Phone: [phone]   Fax:  [phone]  Occupational Therapy Treatment  Patient Details  Name: Michele Snyder MRN: [id number] Date of Birth: [birth year] Referring Provider: Dr. Dannial Monarch  Encounter Date: 02/19/2017      OT End of Session - 02/19/17 0920    Visit Number 6   Number of Visits 12   Date for OT Re-Evaluation 03/17/17   Authorization Type  [employer]   Authorization Time Period approved 6 visits, 4 additional visits approved effective 02/13/17   Authorization - Visit Number 6   Authorization - Number of Visits 10   OT Start Time 9021   OT Stop Time 0930   OT Time Calculation (min) 43 min   Activity Tolerance Patient tolerated treatment well      Past Medical History:  Diagnosis Date  . Allergic rhinitis   . GERD (gastroesophageal reflux disease)   . Migraines     Past Surgical History:  Procedure Laterality Date  . ABDOMINAL HYSTERECTOMY  2008  . CARPAL TUNNEL RELEASE Right 07/20/2015   Procedure: RIGHT CARPAL TUNNEL RELEASE;  Surgeon: Daryll Brod, MD;  Location: [facility];  Service: Orthopedics;  Laterality: Right;    There were no vitals filed for this visit.      Subjective Assessment - 02/19/17 0852    Subjective  The putty ex's are doing good, only a little soreness   Pertinent History Rt CTR 2017, Lt CTS   Patient Stated Goals get my finger better and get back to my regular job   Currently in Pain? No/denies                      OT Treatments/Exercises (OP) - 02/19/17 0001      Hand Exercises   Other Hand Exercises A/ROM in intrinsic (-) position, followed by place and hold ex's holding 5 sec. x 5 reps. Continued IP flexion with pen rolloing activity and v.c's to get most benefit, and in hand manipulation with coins   Other Hand Exercises Clothespin activity for pinching b/t thumb and  last 2 digits (yellow to red resistance). Gripper set at level 1 resistance to pick up blocks for sutained grip strength     LUE Fluidotherapy   Number Minutes Fluidotherapy 12 Minutes   LUE Fluidotherapy Location Hand   Comments at beginning of session to decr. stiffness                  OT Short Term Goals - 02/10/17 0924      OT SHORT TERM GOAL #1   Title Independent with initial HEP    Time 3   Period Weeks   Status Achieved     OT SHORT TERM GOAL #2   Title Independent with splint wear and care prn   Time 3   Period Weeks   Status Achieved     OT SHORT TERM GOAL #3   Title Pt reports overall decreased pain with ROM to Lt ring finger   Time 3   Period Weeks   Status Achieved           OT Long Term Goals - 02/19/17 1155      OT LONG TERM GOAL #1   Title Independent with updated HEP   Time 6   Period Weeks   Status On-going     OT LONG TERM GOAL #2  Title Pt to improve Lt long and ring finger PIP motion by 10 degrees   Baseline 90* for both   Time 6   Period Weeks   Status Partially Met  02/13/17: Lt long = 98*, Lt ring 100*     OT LONG TERM GOAL #3   Title Pt to improve Lt ring DIP motion by 10 degrees or more   Baseline Lt long = 60, ring = 50   Time 6   Period Weeks   Status Revised  02/13/17: Lt ring = 57*     OT LONG TERM GOAL #4   Title Pt to improve grip strength Lt hand by 20 lbs or more to assist in work related tasks   Baseline eval = 30 lbs (Rt = 75 lbs)    Status On-going               Plan - 02/19/17 7142    Clinical Impression Statement Pt progressing towards goals. Pt still has soreness and occasional mild pain/soreness after ex's, but progressing overall.    Rehab Potential Excellent   OT Frequency 2x / week   OT Duration 6 weeks   OT Treatment/Interventions Self-care/ADL training;Moist Heat;Fluidtherapy;DME and/or AE instruction;Splinting;Patient/family education;Compression bandaging;Therapeutic  exercises;Therapeutic activities;Ultrasound;Cryotherapy;Passive range of motion;Parrafin;Electrical Stimulation;Manual Therapy   Plan continue paraffin, review putty HEP, Assess grip strength (LTG #4), start lifting 15-20# boxes BUE's in prep for return to full work duty related tasks   Consulted and Agree with Plan of Care Patient      Patient will benefit from skilled therapeutic intervention in order to improve the following deficits and impairments:  Decreased range of motion, Impaired flexibility, Impaired sensation, Impaired UE functional use, Pain, Decreased strength  Visit Diagnosis: Stiffness of left hand, not elsewhere classified  Muscle weakness (generalized)    Problem List Patient Active Problem List   Diagnosis Date Noted  . GERD (gastroesophageal reflux disease) 07/30/2012  . Anxiety 07/30/2012  . Chest pain 07/30/2012    Carey Bullocks, OTR/L 02/19/2017, 9:25 AM  [facility] [address] [place], [place], [address] Phone: [phone]   Fax:  [phone]  Name: CHARDE MACFARLANE MRN: [id number] Date of Birth: 03/24/1966

## 2017-02-24 ENCOUNTER — Ambulatory Visit: Payer: PRIVATE HEALTH INSURANCE | Attending: Occupational Medicine | Admitting: Occupational Therapy

## 2017-02-24 DIAGNOSIS — M6281 Muscle weakness (generalized): Secondary | ICD-10-CM | POA: Insufficient documentation

## 2017-02-24 DIAGNOSIS — M25642 Stiffness of left hand, not elsewhere classified: Secondary | ICD-10-CM | POA: Insufficient documentation

## 2017-02-24 DIAGNOSIS — M25542 Pain in joints of left hand: Secondary | ICD-10-CM | POA: Insufficient documentation

## 2017-02-24 NOTE — Therapy (Signed)
[facility] [address] [place], [place], [address] Phone: [phone]   Fax:  [phone]  Occupational Therapy Treatment  Patient Details  Name: Michele Snyder MRN: [id number] Date of Birth: 02-Mar-1966 Referring Provider: Dr. Lucia Gaskins  Encounter Date: 02/24/2017      OT End of Session - 02/24/17 1139    Visit Number 7   Number of Visits 12   Date for OT Re-Evaluation 03/17/17   Authorization Type [place] [employer]   Authorization Time Period approved 6 visits, 4 additional visits approved effective 02/13/17   Authorization - Visit Number 7   Authorization - Number of Visits 10   OT Start Time 1100   OT Stop Time 1145   OT Time Calculation (min) 45 min   Activity Tolerance Patient tolerated treatment well      Past Medical History:  Diagnosis Date  . Allergic rhinitis   . GERD (gastroesophageal reflux disease)   . Migraines     Past Surgical History:  Procedure Laterality Date  . ABDOMINAL HYSTERECTOMY  2008  . CARPAL TUNNEL RELEASE Right 07/20/2015   Procedure: RIGHT CARPAL TUNNEL RELEASE;  Surgeon: Cindee Salt, MD;  Location: [facility];  Service: Orthopedics;  Laterality: Right;    There were no vitals filed for this visit.      Subjective Assessment - 02/24/17 1102    Subjective  I don't have any pain today, just sore after ex's. I see the hand doctor tomorrow   Pertinent History Rt CTR 2017, Lt CTS   Patient Stated Goals get my finger better and get back to my regular job   Currently in Pain? No/denies            [facility] OT Assessment - 02/24/17 0001      Left Hand AROM   L Long PIP 0-100 100 Degrees   L Ring PIP 0-100 105 Degrees   L Ring DIP 0-70 55 Degrees                  OT Treatments/Exercises (OP) - 02/24/17 0001      Hand Exercises   Other Hand Exercises Intrinsic (-) "hook" position x 10 reps, followed by active blocking for isolated DIP  flex/ext x 10 reps each   Other Hand Exercises Reviewed putty HEP - Pt demo each x 10-15 reps (as indiciated in HEP). Gripper set at level 1 resistance to pick up blocks Lt hand with min to mod difficulty and 2 rest breaks. Simulated work related tasks by picking up and carrying 20 # box BUE's, then placing from floor onto table x 5 reps.      LUE Paraffin   Number Minutes Paraffin 10 Minutes   LUE Paraffin Location Hand   Comments to decr. stiffness at beginning of session                  OT Short Term Goals - 02/10/17 0924      OT SHORT TERM GOAL #1   Title Independent with initial HEP    Time 3   Period Weeks   Status Achieved     OT SHORT TERM GOAL #2   Title Independent with splint wear and care prn   Time 3   Period Weeks   Status Achieved     OT SHORT TERM GOAL #3   Title Pt reports overall decreased pain with ROM to Lt ring finger   Time 3  Period Weeks   Status Achieved           OT Long Term Goals - 02/24/17 1126      OT LONG TERM GOAL #1   Title Independent with updated HEP   Time 6   Period Weeks   Status Achieved     OT LONG TERM GOAL #2   Title Pt to improve Lt long and ring finger PIP motion by 10 degrees   Baseline 90* for both   Time 6   Period Weeks   Status Achieved  02/13/17: Lt long = 98*, Lt ring 100*. 02/24/17: Lt long = 100*, Lt ring = 105*     OT LONG TERM GOAL #3   Title Pt to improve Lt ring DIP motion by 10 degrees or more   Baseline Lt long = 60, ring = 50   Time 6   Period Weeks   Status Revised  02/13/17: Lt ring = 57*     OT LONG TERM GOAL #4   Title Pt to improve grip strength Lt hand by 20 lbs or more to assist in work related tasks   Baseline eval = 30 lbs (Rt = 75 lbs)    Status On-going               Plan - 02/24/17 1137    Clinical Impression Statement Pt tolerating strengthening well with only mild soreness, no increase in pain. Pt beginning to simulate work related tasks   Rehab Potential  Excellent   OT Frequency 2x / week   OT Duration 6 weeks   OT Treatment/Interventions Self-care/ADL training;Moist Heat;Fluidtherapy;DME and/or AE instruction;Splinting;Patient/family education;Compression bandaging;Therapeutic exercises;Therapeutic activities;Ultrasound;Cryotherapy;Passive range of motion;Parrafin;Electrical Stimulation;Manual Therapy   Plan continue paraffin, f/u with patient on MD visit, continue ROM, grip and pinch strengthening, and work related tasks.    Consulted and Agree with Plan of Care Patient      Patient will benefit from skilled therapeutic intervention in order to improve the following deficits and impairments:  Decreased range of motion, Impaired flexibility, Impaired sensation, Impaired UE functional use, Pain, Decreased strength  Visit Diagnosis: Stiffness of left hand, not elsewhere classified  Muscle weakness (generalized)    Problem List Patient Active Problem List   Diagnosis Date Noted  . GERD (gastroesophageal reflux disease) 07/30/2012  . Anxiety 07/30/2012  . Chest pain 07/30/2012    Kelli Churn, OTR/L 02/24/2017, 11:41 AM  [facility] [address] [place], [place], [address] Phone: [phone]   Fax:  [phone]  Name: Michele Snyder MRN: [id number] Date of Birth: Aug 12, 1965

## 2017-02-27 ENCOUNTER — Ambulatory Visit: Payer: PRIVATE HEALTH INSURANCE | Admitting: Occupational Therapy

## 2017-02-27 DIAGNOSIS — M25642 Stiffness of left hand, not elsewhere classified: Secondary | ICD-10-CM | POA: Diagnosis not present

## 2017-02-27 DIAGNOSIS — M6281 Muscle weakness (generalized): Secondary | ICD-10-CM

## 2017-02-27 DIAGNOSIS — M25542 Pain in joints of left hand: Secondary | ICD-10-CM

## 2017-02-27 NOTE — Therapy (Signed)
[facility] [address] [place], [place], [address] Phone: [phone]   Fax:  [phone]  Occupational Therapy Treatment  Patient Details  Name: Michele Snyder MRN: [id number] Date of Birth: 10/30/1965 Referring Provider: Dr. Lucia Gaskins  Encounter Date: 02/27/2017      OT End of Session - 02/27/17 1551    Visit Number 8   Number of Visits 12   Date for OT Re-Evaluation 03/17/17   Authorization Type [place] [employer]   Authorization Time Period approved 6 visits, 4 additional visits approved effective 02/13/17   Authorization - Visit Number 8   Authorization - Number of Visits 10   OT Start Time 1315   OT Stop Time 1400   OT Time Calculation (min) 45 min   Activity Tolerance Patient tolerated treatment well      Past Medical History:  Diagnosis Date  . Allergic rhinitis   . GERD (gastroesophageal reflux disease)   . Migraines     Past Surgical History:  Procedure Laterality Date  . ABDOMINAL HYSTERECTOMY  2008  . CARPAL TUNNEL RELEASE Right 07/20/2015   Procedure: RIGHT CARPAL TUNNEL RELEASE;  Surgeon: Cindee Salt, MD;  Location: [facility];  Service: Orthopedics;  Laterality: Right;    There were no vitals filed for this visit.      Subjective Assessment - 02/27/17 1326    Subjective  The doctor said everything I am doing is fine, and to gradually build up lifting the boxes from 20 lbs to increased weight. I will see him again in a month.   Pertinent History Rt CTR 2017, Lt CTS   Patient Stated Goals get my finger better and get back to my regular job   Currently in Pain? No/denies                      OT Treatments/Exercises (OP) - 02/27/17 0001      ADLs   Work Simulated work related tasks: picking up and carrying 20 # box BUE's, then placing from floor to table x 5 reps     Hand Exercises   Other Hand Exercises Intrinsic (-) "hook" position x 10 reps,  followed by active blocking for isolated DIP flex/ext x 10 reps each   Other Hand Exercises Gripper set at level 1 resistance to pick up blocks for sustained grip strength Lt hand. Clothespin activity for pinch strenth (yellow to red resistance only) b/t last 2 digits and thumb     LUE Paraffin   Number Minutes Paraffin 12 Minutes   LUE Paraffin Location Hand                  OT Short Term Goals - 02/10/17 6962      OT SHORT TERM GOAL #1   Title Independent with initial HEP    Time 3   Period Weeks   Status Achieved     OT SHORT TERM GOAL #2   Title Independent with splint wear and care prn   Time 3   Period Weeks   Status Achieved     OT SHORT TERM GOAL #3   Title Pt reports overall decreased pain with ROM to Lt ring finger   Time 3   Period Weeks   Status Achieved           OT Long Term Goals - 02/24/17 1126      OT LONG TERM GOAL #1   Title  Independent with updated HEP   Time 6   Period Weeks   Status Achieved     OT LONG TERM GOAL #2   Title Pt to improve Lt long and ring finger PIP motion by 10 degrees   Baseline 90* for both   Time 6   Period Weeks   Status Achieved  02/13/17: Lt long = 98*, Lt ring 100*. 02/24/17: Lt long = 100*, Lt ring = 105*     OT LONG TERM GOAL #3   Title Pt to improve Lt ring DIP motion by 10 degrees or more   Baseline Lt long = 60, ring = 50   Time 6   Period Weeks   Status Revised  02/13/17: Lt ring = 57*     OT LONG TERM GOAL #4   Title Pt to improve grip strength Lt hand by 20 lbs or more to assist in work related tasks   Baseline eval = 30 lbs (Rt = 75 lbs)    Status On-going               Plan - 02/27/17 1552    Clinical Impression Statement Pt continues to make progress towards strengthening and work related goals. Pt still becomes stiff at DIP joint of Lt ring finger and encouraged pt to continue ex's and wear splint less at work   Rehab Potential Excellent   OT Frequency 2x / week   OT Duration 6  weeks   OT Treatment/Interventions Self-care/ADL training;Moist Heat;Fluidtherapy;DME and/or AE instruction;Splinting;Patient/family education;Compression bandaging;Therapeutic exercises;Therapeutic activities;Ultrasound;Cryotherapy;Passive range of motion;Parrafin;Electrical Stimulation;Manual Therapy   Plan Pt to reduce down to 1x/wk every other week to monitor and progress work related tasks. Begin lifting 25 # box next visit   Consulted and Agree with Plan of Care Patient      Patient will benefit from skilled therapeutic intervention in order to improve the following deficits and impairments:  Decreased range of motion, Impaired flexibility, Impaired sensation, Impaired UE functional use, Pain, Decreased strength  Visit Diagnosis: Stiffness of left hand, not elsewhere classified  Muscle weakness (generalized)  Pain in joint of left hand    Problem List Patient Active Problem List   Diagnosis Date Noted  . GERD (gastroesophageal reflux disease) 07/30/2012  . Anxiety 07/30/2012  . Chest pain 07/30/2012    Kelli Churn, OTR/L 02/27/2017, 3:56 PM  [facility] [address] [place], [place], [address] Phone: [phone]   Fax:  [phone]  Name: Michele Snyder MRN: [id number] Date of Birth: [birth year]

## 2017-03-17 ENCOUNTER — Ambulatory Visit: Payer: PRIVATE HEALTH INSURANCE | Admitting: Occupational Therapy

## 2017-03-17 DIAGNOSIS — M6281 Muscle weakness (generalized): Secondary | ICD-10-CM

## 2017-03-17 DIAGNOSIS — M25642 Stiffness of left hand, not elsewhere classified: Secondary | ICD-10-CM | POA: Diagnosis not present

## 2017-03-17 NOTE — Therapy (Signed)
[facility] [address] [place], [place], [address] Phone: [phone]   Fax:  [phone]  Occupational Therapy Treatment  Patient Details  Name: Michele SicksSharon J Larke MRN: [id number] Date of Birth: 1965-07-31 Referring Provider: Dr. Lucia GaskinsMary Hunt  Encounter Date: 03/17/2017      OT End of Session - 03/17/17 1213    Visit Number 9   Number of Visits 12   Date for OT Re-Evaluation 03/17/17   Authorization Type [place] [employer]   Authorization Time Period approved 6 visits, 4 additional visits approved effective 02/13/17   Authorization - Visit Number 9   Authorization - Number of Visits 10   OT Start Time 1100   OT Stop Time 1143   OT Time Calculation (min) 43 min   Activity Tolerance Patient tolerated treatment well      Past Medical History:  Diagnosis Date  . Allergic rhinitis   . GERD (gastroesophageal reflux disease)   . Migraines     Past Surgical History:  Procedure Laterality Date  . ABDOMINAL HYSTERECTOMY  2008  . CARPAL TUNNEL RELEASE Right 07/20/2015   Procedure: RIGHT CARPAL TUNNEL RELEASE;  Surgeon: Cindee SaltGary Kuzma, MD;  Location: [facility];  Service: Orthopedics;  Laterality: Right;    There were no vitals filed for this visit.      Subjective Assessment - 03/17/17 1108    Subjective  I haven't worn my splint at work. I've been trying to keep it off. I've gradually been building up lifting at work too.    Pertinent History Rt CTR 2017, Lt CTS   Patient Stated Goals get my finger better and get back to my regular job   Currently in Pain? No/denies            [facility] Assessment - 03/17/17 0001      Hand Function   Left Hand Grip (lbs) 34 lbs, 39 lbs, 45 lbs                  OT Treatments/Exercises (OP) - 03/17/17 0001      ADLs   Work Simulated work related tasks: picking up and carrying 25# box BUE's, then placing from floor to elevated table x 5 reps     Hand Exercises   Other Hand Exercises Intrinsic (-) "hook" position x 10 reps, followed by place and hold ex's for increased DIP flexion. Followed by isolated DIP flex/ext x 15 reps   Other Hand Exercises Gripper set at level 1 resistance to pick up 1/2 amt of blocks for sustained grip strength Lt hand. Increased to level 2 resistance for second 1/2 of blocks with min drops, mod difficulty and 1 rest break required     LUE Paraffin   Number Minutes Paraffin 12 Minutes   LUE Paraffin Location Hand   Comments at beginning of session to decr. stiffness                  OT Short Term Goals - 02/10/17 0924      OT SHORT TERM GOAL #1   Title Independent with initial HEP    Time 3   Period Weeks   Status Achieved     OT SHORT TERM GOAL #2   Title Independent with splint wear and care prn   Time 3   Period Weeks   Status Achieved     OT SHORT TERM GOAL #3   Title Pt reports overall decreased pain with  ROM to Lt ring finger   Time 3   Period Weeks   Status Achieved           OT Long Term Goals - 02/24/17 1126      OT LONG TERM GOAL #1   Title Independent with updated HEP   Time 6   Period Weeks   Status Achieved     OT LONG TERM GOAL #2   Title Pt to improve Lt long and ring finger PIP motion by 10 degrees   Baseline 90* for both   Time 6   Period Weeks   Status Achieved  02/13/17: Lt long = 98*, Lt ring 100*. 02/24/17: Lt long = 100*, Lt ring = 105*     OT LONG TERM GOAL #3   Title Pt to improve Lt ring DIP motion by 10 degrees or more   Baseline Lt long = 60, ring = 50   Time 6   Period Weeks   Status Revised  02/13/17: Lt ring = 57*     OT LONG TERM GOAL #4   Title Pt to improve grip strength Lt hand by 20 lbs or more to assist in work related tasks   Baseline eval = 30 lbs (Rt = 75 lbs)    Status On-going               Plan - 03/17/17 1213    Clinical Impression Statement Pt is progressing towards strength Lt hand for functional gripping  and work related tasks.    Rehab Potential Excellent   OT Frequency 2x / week   OT Duration 6 weeks   OT Treatment/Interventions Self-care/ADL training;Moist Heat;Fluidtherapy;DME and/or AE instruction;Splinting;Patient/family education;Compression bandaging;Therapeutic exercises;Therapeutic activities;Ultrasound;Cryotherapy;Passive range of motion;Parrafin;Electrical Stimulation;Manual Therapy   Plan anticipate d/c next visit, assess remaining goals   Consulted and Agree with Plan of Care Patient      Patient will benefit from skilled therapeutic intervention in order to improve the following deficits and impairments:  Decreased range of motion, Impaired flexibility, Impaired sensation, Impaired UE functional use, Pain, Decreased strength  Visit Diagnosis: Stiffness of left hand, not elsewhere classified  Muscle weakness (generalized)    Problem List Patient Active Problem List   Diagnosis Date Noted  . GERD (gastroesophageal reflux disease) 07/30/2012  . Anxiety 07/30/2012  . Chest pain 07/30/2012    Kelli Churn, OTR/L 03/17/2017, 12:16 PM  [facility] [address] [place], [place], [address] Phone: [phone]   Fax:  [phone]  Name: Michele Snyder MRN: [id number] Date of Birth: 12-Oct-1965

## 2017-04-01 ENCOUNTER — Ambulatory Visit: Payer: 59 | Attending: Occupational Medicine | Admitting: Occupational Therapy

## 2017-04-01 DIAGNOSIS — M25642 Stiffness of left hand, not elsewhere classified: Secondary | ICD-10-CM | POA: Diagnosis not present

## 2017-04-01 DIAGNOSIS — M6281 Muscle weakness (generalized): Secondary | ICD-10-CM | POA: Diagnosis not present

## 2017-04-01 NOTE — Therapy (Signed)
[facility] [address] [place], [place], [address] Phone: [phone]   Fax:  [phone]  Occupational Therapy Treatment  Patient Details  Name: Michele Snyder MRN: [id number] Date of Birth: 04-09-1966 Referring Provider: Dr. Dannial Monarch   Encounter Date: 04/01/2017  OT End of Session - 04/01/17 1234    Visit Number  10    Number of Visits  12    Date for OT Re-Evaluation  03/17/17    Authorization Type  [place] [employer]    Authorization Time Period  approved 6 visits, 4 additional visits approved effective 02/13/17    Authorization - Visit Number  10    Authorization - Number of Visits  10    OT Start Time  9381    OT Stop Time  1220    OT Time Calculation (min)  35 min    Activity Tolerance  Patient tolerated treatment well       Past Medical History:  Diagnosis Date  . Allergic rhinitis   . GERD (gastroesophageal reflux disease)   . Migraines     Past Surgical History:  Procedure Laterality Date  . ABDOMINAL HYSTERECTOMY  2008    There were no vitals filed for this visit.  Subjective Assessment - 04/01/17 1150    Subjective   Dr. Grandville Silos released me to go back to work. I don't really have pain anymore; just occasional soreness    Pertinent History  Rt CTR 2017, Lt CTS    Patient Stated Goals  get my finger better and get back to my regular job    Currently in Pain?  No/denies         [facility] OT Assessment - 04/01/17 0001      Left Hand AROM   L Ring DIP 0-70  55 Degrees      Hand Function   Left Hand Grip (lbs)  41 lbs               OT Treatments/Exercises (OP) - 04/01/17 0001      ADLs   ADL Comments  Discussed progress towards goals and D/C      Hand Exercises   DIPJ Flexion  AROM;10 reps      LUE Paraffin   Number Minutes Paraffin  12 Minutes    LUE Paraffin Location  Hand    Comments  at beginning of session to decr. stiffness               OT  Short Term Goals - 02/10/17 0924      OT SHORT TERM GOAL #1   Title  Independent with initial HEP     Time  3    Period  Weeks    Status  Achieved      OT SHORT TERM GOAL #2   Title  Independent with splint wear and care prn    Time  3    Period  Weeks    Status  Achieved      OT SHORT TERM GOAL #3   Title  Pt reports overall decreased pain with ROM to Lt ring finger    Time  3    Period  Weeks    Status  Achieved        OT Long Term Goals - 04/01/17 1236      OT LONG TERM GOAL #1   Title  Independent with updated HEP    Time  6    Period  Weeks    Status  Achieved      OT LONG TERM GOAL #2   Title  Pt to improve Lt long and ring finger PIP motion by 10 degrees    Baseline  90* for both    Time  6    Period  Weeks    Status  Achieved 02/13/17: Lt long = 98*, Lt ring 100*. 02/24/17: Lt long = 100*, Lt ring = 105*   02/13/17: Lt long = 98*, Lt ring 100*. 02/24/17: Lt long = 100*, Lt ring = 105*     OT LONG TERM GOAL #3   Title  Pt to improve Lt ring DIP motion by 10 degrees or more    Baseline  Lt long = 60, ring = 50    Time  6    Period  Weeks    Status  Not Met 55 degrees   55 degrees     OT LONG TERM GOAL #4   Title  Pt to improve grip strength Lt hand by 20 lbs or more to assist in work related tasks    Baseline  eval = 30 lbs (Rt = 75 lbs)     Status  Not Met 41 lbs   41 lbs           Plan - 04/01/17 1238    Clinical Impression Statement  Pt has met 2/4 LTG's and has improved with remaining unmet goals. Pt is released to return to full work duties    Rehab Potential  Excellent    OT Treatment/Interventions  Self-care/ADL training;Moist Heat;Fluidtherapy;DME and/or AE instruction;Splinting;Patient/family education;Compression bandaging;Therapeutic exercises;Therapeutic activities;Ultrasound;Cryotherapy;Passive range of motion;Parrafin;Electrical Stimulation;Manual Therapy    Plan  D/C OT    Consulted and Agree with Plan of Care  Patient        Patient will benefit from skilled therapeutic intervention in order to improve the following deficits and impairments:  Decreased range of motion, Impaired flexibility, Impaired sensation, Impaired UE functional use, Pain, Decreased strength  Visit Diagnosis: Stiffness of left hand, not elsewhere classified  Muscle weakness (generalized)    Problem List Patient Active Problem List   Diagnosis Date Noted  . GERD (gastroesophageal reflux disease) 07/30/2012  . Anxiety 07/30/2012  . Chest pain 07/30/2012    OCCUPATIONAL THERAPY DISCHARGE SUMMARY  Visits from Start of Care: 10  Current functional level related to goals / functional outcomes: SEE ABOVE   Remaining deficits: Lt ring DIP joint edema Mild weakness Lt hand   Education / Equipment: HEP's  Plan: Patient agrees to discharge.  Patient goals were partially met. Patient is being discharged due to being pleased with the current functional level.  ?????        Carey Bullocks, OTR/L 04/01/2017, 12:40 PM  [facility] [address] [place], [place], [address] Phone: [phone]   Fax:  [phone]  Name: ANYLAH SCHEIB MRN: [id number] Date of Birth: [birth year]

## 2017-12-01 DIAGNOSIS — Z0000 Encounter for general adult medical examination without abnormal findings: Secondary | ICD-10-CM | POA: Diagnosis not present

## 2017-12-04 DIAGNOSIS — Z0000 Encounter for general adult medical examination without abnormal findings: Secondary | ICD-10-CM | POA: Diagnosis not present

## 2017-12-04 DIAGNOSIS — Z6826 Body mass index (BMI) 26.0-26.9, adult: Secondary | ICD-10-CM | POA: Diagnosis not present

## 2017-12-04 DIAGNOSIS — N951 Menopausal and female climacteric states: Secondary | ICD-10-CM | POA: Diagnosis not present

## 2017-12-13 DIAGNOSIS — Z6826 Body mass index (BMI) 26.0-26.9, adult: Secondary | ICD-10-CM | POA: Diagnosis not present

## 2017-12-13 DIAGNOSIS — Z202 Contact with and (suspected) exposure to infections with a predominantly sexual mode of transmission: Secondary | ICD-10-CM | POA: Diagnosis not present

## 2017-12-13 DIAGNOSIS — Z7689 Persons encountering health services in other specified circumstances: Secondary | ICD-10-CM | POA: Diagnosis not present

## 2017-12-25 ENCOUNTER — Other Ambulatory Visit: Payer: Self-pay | Admitting: Family Medicine

## 2017-12-25 DIAGNOSIS — Z1231 Encounter for screening mammogram for malignant neoplasm of breast: Secondary | ICD-10-CM

## 2018-01-01 DIAGNOSIS — H52221 Regular astigmatism, right eye: Secondary | ICD-10-CM | POA: Diagnosis not present

## 2018-01-01 DIAGNOSIS — H524 Presbyopia: Secondary | ICD-10-CM | POA: Diagnosis not present

## 2018-01-01 DIAGNOSIS — H16223 Keratoconjunctivitis sicca, not specified as Sjogren's, bilateral: Secondary | ICD-10-CM | POA: Diagnosis not present

## 2018-01-01 DIAGNOSIS — H5203 Hypermetropia, bilateral: Secondary | ICD-10-CM | POA: Diagnosis not present

## 2018-01-01 MED FILL — XIIDRA 5% EYE DROPS: 5 | 30 days supply | Qty: 60 | Fill #0

## 2018-01-19 ENCOUNTER — Ambulatory Visit
Admission: RE | Admit: 2018-01-19 | Discharge: 2018-01-19 | Disposition: A | Discharge status: Home / Self Care | Payer: 59 | Source: Ambulatory Visit | Attending: Family Medicine | Admitting: Family Medicine

## 2018-01-19 DIAGNOSIS — Z1231 Encounter for screening mammogram for malignant neoplasm of breast: Secondary | ICD-10-CM

## 2018-03-31 DIAGNOSIS — I10 Essential (primary) hypertension: Secondary | ICD-10-CM | POA: Diagnosis not present

## 2018-03-31 DIAGNOSIS — Z6828 Body mass index (BMI) 28.0-28.9, adult: Secondary | ICD-10-CM | POA: Diagnosis not present

## 2018-03-31 DIAGNOSIS — Z713 Dietary counseling and surveillance: Secondary | ICD-10-CM | POA: Diagnosis not present

## 2018-04-27 DIAGNOSIS — Z6827 Body mass index (BMI) 27.0-27.9, adult: Secondary | ICD-10-CM | POA: Diagnosis not present

## 2018-04-27 DIAGNOSIS — I10 Essential (primary) hypertension: Secondary | ICD-10-CM | POA: Diagnosis not present

## 2018-04-27 DIAGNOSIS — Z1389 Encounter for screening for other disorder: Secondary | ICD-10-CM | POA: Diagnosis not present

## 2018-04-27 DIAGNOSIS — Z713 Dietary counseling and surveillance: Secondary | ICD-10-CM | POA: Diagnosis not present

## 2018-04-27 DIAGNOSIS — Z1331 Encounter for screening for depression: Secondary | ICD-10-CM | POA: Diagnosis not present

## 2018-06-26 DIAGNOSIS — Z6827 Body mass index (BMI) 27.0-27.9, adult: Secondary | ICD-10-CM | POA: Diagnosis not present

## 2018-06-26 DIAGNOSIS — J111 Influenza due to unidentified influenza virus with other respiratory manifestations: Secondary | ICD-10-CM | POA: Diagnosis not present

## 2018-11-04 MED FILL — SM CLEARLAX POWDER: 17 | 90 days supply | Qty: 1530 | Fill #0

## 2018-12-07 DIAGNOSIS — Z0000 Encounter for general adult medical examination without abnormal findings: Secondary | ICD-10-CM | POA: Diagnosis not present

## 2018-12-09 DIAGNOSIS — Z0000 Encounter for general adult medical examination without abnormal findings: Secondary | ICD-10-CM | POA: Diagnosis not present

## 2018-12-09 DIAGNOSIS — Z6824 Body mass index (BMI) 24.0-24.9, adult: Secondary | ICD-10-CM | POA: Diagnosis not present

## 2018-12-17 ENCOUNTER — Other Ambulatory Visit: Payer: Self-pay | Admitting: Physician Assistant

## 2018-12-17 DIAGNOSIS — Z1231 Encounter for screening mammogram for malignant neoplasm of breast: Secondary | ICD-10-CM

## 2019-01-25 ENCOUNTER — Other Ambulatory Visit: Payer: Self-pay

## 2019-01-25 ENCOUNTER — Ambulatory Visit
Admission: RE | Admit: 2019-01-25 | Discharge: 2019-01-25 | Disposition: A | Discharge status: Home / Self Care | Payer: 59 | Source: Ambulatory Visit | Attending: Physician Assistant | Admitting: Physician Assistant

## 2019-01-25 DIAGNOSIS — Z1231 Encounter for screening mammogram for malignant neoplasm of breast: Secondary | ICD-10-CM

## 2019-02-15 DIAGNOSIS — S99829A Other specified injuries of unspecified foot, initial encounter: Secondary | ICD-10-CM | POA: Diagnosis not present

## 2019-02-15 DIAGNOSIS — Z6824 Body mass index (BMI) 24.0-24.9, adult: Secondary | ICD-10-CM | POA: Diagnosis not present

## 2019-03-16 DIAGNOSIS — H524 Presbyopia: Secondary | ICD-10-CM | POA: Diagnosis not present

## 2019-03-16 DIAGNOSIS — H5203 Hypermetropia, bilateral: Secondary | ICD-10-CM | POA: Diagnosis not present

## 2019-04-16 DIAGNOSIS — L603 Nail dystrophy: Secondary | ICD-10-CM | POA: Diagnosis not present

## 2019-04-16 DIAGNOSIS — B351 Tinea unguium: Secondary | ICD-10-CM | POA: Diagnosis not present

## 2019-04-16 DIAGNOSIS — L601 Onycholysis: Secondary | ICD-10-CM | POA: Diagnosis not present

## 2019-05-07 DIAGNOSIS — B351 Tinea unguium: Secondary | ICD-10-CM | POA: Diagnosis not present

## 2019-05-10 ENCOUNTER — Other Ambulatory Visit: Payer: Self-pay

## 2019-05-10 ENCOUNTER — Other Ambulatory Visit (HOSPITAL_COMMUNITY)
Admission: RE | Admit: 2019-05-10 | Discharge: 2019-05-10 | Disposition: A | Discharge status: Home / Self Care | Payer: 59 | Source: Ambulatory Visit | Attending: Podiatry | Admitting: Podiatry

## 2019-05-10 DIAGNOSIS — I10 Essential (primary) hypertension: Secondary | ICD-10-CM | POA: Diagnosis not present

## 2019-05-10 LAB — HEPATIC FUNCTION PANEL
ALT: 14 U/L (ref 0–44)
AST: 18 U/L (ref 15–41)
Albumin: 4.1 g/dL (ref 3.5–5.0)
Alkaline Phosphatase: 51 U/L (ref 38–126)
Bilirubin, Direct: 0.1 mg/dL (ref 0.0–0.2)
Indirect Bilirubin: 0.8 mg/dL (ref 0.3–0.9)
Total Bilirubin: 0.9 mg/dL (ref 0.3–1.2)
Total Protein: 7.3 g/dL (ref 6.5–8.1)

## 2019-05-10 MED FILL — TERBINAFINE HCL 250 MG TAB: 250 | 90 days supply | Qty: 90 | Fill #0

## 2019-06-16 MED FILL — SM CLEARLAX POWDER: 17 | 90 days supply | Qty: 1530 | Fill #0

## 2019-08-06 DIAGNOSIS — B351 Tinea unguium: Secondary | ICD-10-CM | POA: Diagnosis not present

## 2019-09-14 DIAGNOSIS — M19071 Primary osteoarthritis, right ankle and foot: Secondary | ICD-10-CM | POA: Diagnosis not present

## 2019-09-14 DIAGNOSIS — Z6825 Body mass index (BMI) 25.0-25.9, adult: Secondary | ICD-10-CM | POA: Diagnosis not present

## 2019-12-09 DIAGNOSIS — I10 Essential (primary) hypertension: Secondary | ICD-10-CM | POA: Diagnosis not present

## 2019-12-09 DIAGNOSIS — Z0000 Encounter for general adult medical examination without abnormal findings: Secondary | ICD-10-CM | POA: Diagnosis not present

## 2019-12-09 DIAGNOSIS — D7589 Other specified diseases of blood and blood-forming organs: Secondary | ICD-10-CM | POA: Diagnosis not present

## 2019-12-09 DIAGNOSIS — Z13220 Encounter for screening for lipoid disorders: Secondary | ICD-10-CM | POA: Diagnosis not present

## 2019-12-13 DIAGNOSIS — Z6824 Body mass index (BMI) 24.0-24.9, adult: Secondary | ICD-10-CM | POA: Diagnosis not present

## 2019-12-13 DIAGNOSIS — Z0000 Encounter for general adult medical examination without abnormal findings: Secondary | ICD-10-CM | POA: Diagnosis not present

## 2020-01-11 ENCOUNTER — Other Ambulatory Visit: Payer: Self-pay | Admitting: Physician Assistant

## 2020-01-11 DIAGNOSIS — Z1231 Encounter for screening mammogram for malignant neoplasm of breast: Secondary | ICD-10-CM

## 2020-01-27 ENCOUNTER — Ambulatory Visit
Admission: RE | Admit: 2020-01-27 | Discharge: 2020-01-27 | Disposition: A | Discharge status: Home / Self Care | Payer: 59 | Source: Ambulatory Visit | Attending: Physician Assistant | Admitting: Physician Assistant

## 2020-01-27 ENCOUNTER — Other Ambulatory Visit: Payer: Self-pay

## 2020-01-27 DIAGNOSIS — Z1231 Encounter for screening mammogram for malignant neoplasm of breast: Secondary | ICD-10-CM | POA: Diagnosis not present

## 2020-02-25 DIAGNOSIS — B351 Tinea unguium: Secondary | ICD-10-CM | POA: Diagnosis not present

## 2020-05-30 DIAGNOSIS — H524 Presbyopia: Secondary | ICD-10-CM | POA: Diagnosis not present

## 2020-05-30 DIAGNOSIS — H5203 Hypermetropia, bilateral: Secondary | ICD-10-CM | POA: Diagnosis not present

## 2020-05-30 DIAGNOSIS — H2513 Age-related nuclear cataract, bilateral: Secondary | ICD-10-CM | POA: Diagnosis not present

## 2020-06-02 ENCOUNTER — Other Ambulatory Visit (HOSPITAL_COMMUNITY): Payer: Self-pay | Admitting: Podiatry

## 2020-06-02 DIAGNOSIS — B351 Tinea unguium: Secondary | ICD-10-CM | POA: Diagnosis not present

## 2020-06-02 MED FILL — TERBINAFINE HCL 250 MG TABS: 250 | 90 days supply | Qty: 90 | Fill #0

## 2020-09-05 ENCOUNTER — Other Ambulatory Visit (HOSPITAL_COMMUNITY): Payer: Self-pay

## 2020-09-12 DIAGNOSIS — B351 Tinea unguium: Secondary | ICD-10-CM | POA: Diagnosis not present

## 2020-12-13 DIAGNOSIS — Z0000 Encounter for general adult medical examination without abnormal findings: Secondary | ICD-10-CM | POA: Diagnosis not present

## 2020-12-13 DIAGNOSIS — Z13220 Encounter for screening for lipoid disorders: Secondary | ICD-10-CM | POA: Diagnosis not present

## 2020-12-13 DIAGNOSIS — I10 Essential (primary) hypertension: Secondary | ICD-10-CM | POA: Diagnosis not present

## 2020-12-14 ENCOUNTER — Emergency Department (HOSPITAL_COMMUNITY): Payer: 59

## 2020-12-14 ENCOUNTER — Encounter (HOSPITAL_COMMUNITY): Payer: Self-pay | Admitting: *Deleted

## 2020-12-14 ENCOUNTER — Other Ambulatory Visit: Payer: Self-pay

## 2020-12-14 ENCOUNTER — Emergency Department (HOSPITAL_COMMUNITY)
Admission: EM | Admit: 2020-12-14 | Discharge: 2020-12-14 | Disposition: A | Discharge status: Home / Self Care | Payer: 59 | Attending: Emergency Medicine | Admitting: Emergency Medicine

## 2020-12-14 DIAGNOSIS — Z6826 Body mass index (BMI) 26.0-26.9, adult: Secondary | ICD-10-CM | POA: Diagnosis not present

## 2020-12-14 DIAGNOSIS — R0789 Other chest pain: Secondary | ICD-10-CM | POA: Insufficient documentation

## 2020-12-14 DIAGNOSIS — R079 Chest pain, unspecified: Secondary | ICD-10-CM

## 2020-12-14 DIAGNOSIS — R0602 Shortness of breath: Secondary | ICD-10-CM | POA: Diagnosis not present

## 2020-12-14 LAB — COMPREHENSIVE METABOLIC PANEL
ALT: 13 U/L (ref 0–44)
AST: 16 U/L (ref 15–41)
Albumin: 4.1 g/dL (ref 3.5–5.0)
Alkaline Phosphatase: 42 U/L (ref 38–126)
Anion gap: 8 (ref 5–15)
BUN: 17 mg/dL (ref 6–20)
CO2: 27 mmol/L (ref 22–32)
Calcium: 9.1 mg/dL (ref 8.9–10.3)
Chloride: 103 mmol/L (ref 98–111)
Creatinine, Ser: 0.73 mg/dL (ref 0.44–1.00)
GFR, Estimated: >60 mL/min (ref >60)
Glucose, Bld: 102 mg/dL — ABNORMAL HIGH (ref 70–99)
Potassium: 3.8 mmol/L (ref 3.5–5.1)
Sodium: 138 mmol/L (ref 135–145)
Total Bilirubin: 0.6 mg/dL (ref 0.3–1.2)
Total Protein: 7.1 g/dL (ref 6.5–8.1)

## 2020-12-14 LAB — CBC WITH DIFFERENTIAL/PLATELET
Abs Immature Granulocytes: 0.01 10*3/uL (ref 0.00–0.07)
Basophils Absolute: 0.1 10*3/uL (ref 0.0–0.1)
Basophils Relative: 1 %
Eosinophils Absolute: 0.2 10*3/uL (ref 0.0–0.5)
Eosinophils Relative: 3 %
HCT: 36.5 % (ref 36.0–46.0)
Hemoglobin: 11.6 g/dL — ABNORMAL LOW (ref 12.0–15.0)
Immature Granulocytes: 0 %
Lymphocytes Relative: 40 %
Lymphs Abs: 2.7 10*3/uL (ref 0.7–4.0)
MCH: 21.7 pg — ABNORMAL LOW (ref 26.0–34.0)
MCHC: 31.8 g/dL (ref 30.0–36.0)
MCV: 68.2 fL — ABNORMAL LOW (ref 80.0–100.0)
Monocytes Absolute: 0.5 10*3/uL (ref 0.1–1.0)
Monocytes Relative: 8 %
Neutro Abs: 3.2 10*3/uL (ref 1.7–7.7)
Neutrophils Relative %: 48 %
Platelets: 259 10*3/uL (ref 150–400)
RBC: 5.35 MIL/uL — ABNORMAL HIGH (ref 3.87–5.11)
RDW: 15.8 % — ABNORMAL HIGH (ref 11.5–15.5)
WBC: 6.6 10*3/uL (ref 4.0–10.5)
nRBC: 0 % (ref 0.0–0.2)

## 2020-12-14 LAB — TROPONIN I (HIGH SENSITIVITY)
Troponin I (High Sensitivity): <2 ng/L (ref <18)
Troponin I (High Sensitivity): <2 ng/L (ref <18)

## 2020-12-14 LAB — LIPASE, BLOOD: Lipase: 35 U/L (ref 11–51)

## 2020-12-14 NOTE — ED Provider Notes (Signed)
[facility] EMERGENCY DEPARTMENT Provider Note   CSN: [id number] Arrival date & time: 12/14/20  1657     History Chief Complaint  Patient presents with   Chest Pain    Michele Snyder is a 55 y.o. female.   Chest Pain Associated symptoms: no abdominal pain, no cough, no diaphoresis, no dizziness, no fever, no nausea, no numbness, no vomiting and no weakness    HPI: A 55 year old patient presents for evaluation of chest pain. Initial onset of pain was less than one hour ago. The patient's chest pain is not worse with exertion. The patient's chest pain is middle- or left-sided, is not well-localized, is not described as heaviness/pressure/tightness, is not sharp and does not radiate to the arms/jaw/neck. The patient does not complain of nausea and denies diaphoresis. The patient has no history of stroke, has no history of peripheral artery disease, has not smoked in the past 90 days, denies any history of treated diabetes, has no relevant family history of coronary artery disease (first degree relative at less than age 3), is not hypertensive, has no history of hypercholesterolemia and does not have an elevated BMI (>=30).    Michele Snyder is a 55 y.o. female, with a history of GERD, migraines, presenting to the ED with chest discomfort intermittently over the last 3 weeks.  Patient presented today because she was seen by her PCP and told them about her chest pain.  She was then told she needed to go to the ED for further evaluation. Chest pain seems to vary as to location in the chest, it is intermittent, "it feels like a gas bubble," sometimes accompanied by brief shortness of breath.  Episodes last anywhere from a few seconds to several minutes.  She has not noticed any onset with exertion.  Denies fever/chills, cough, orthopnea, lower extremity pain/edema, abdominal pain, numbness, weakness, diaphoresis, dizziness, N/V/D, or any other complaints.  Seen in triage at  5:11pm.      Past Medical History:  Diagnosis Date   Allergic rhinitis    GERD (gastroesophageal reflux disease)    Migraines     Patient Active Problem List   Diagnosis Date Noted   GERD (gastroesophageal reflux disease) 07/30/2012   Anxiety 07/30/2012   Chest pain 07/30/2012    Past Surgical History:  Procedure Laterality Date   ABDOMINAL HYSTERECTOMY  2008   CARPAL TUNNEL RELEASE Right 07/20/2015   Procedure: RIGHT CARPAL TUNNEL RELEASE;  Surgeon: Cindee Salt, MD;  Location: [facility];  Service: Orthopedics;  Laterality: Right;     OB History   No obstetric history on file.     Family History  Problem Relation Age of Onset   Breast cancer Neg Hx     Social History   Tobacco Use   Smoking status: Never   Smokeless tobacco: Never  Substance Use Topics   Alcohol use: No   Drug use: No    Home Medications Prior to Admission medications   Medication Sig Start Date End Date Taking? Authorizing Provider  ALPRAZolam Prudy Feeler) 0.5 MG tablet Take 0.5 mg by mouth as needed for sleep.    [provider]  calcium-vitamin D 250-100 MG-UNIT tablet Take 1 tablet by mouth 2 (two) times daily.    [provider]  cetirizine (ZYRTEC) 10 MG chewable tablet Chew 10 mg by mouth daily.    [provider]  HYDROcodone-acetaminophen (NORCO) 5-325 MG tablet Take 1 tablet by mouth every 6 (six) hours as needed for  moderate pain. Patient not taking: Reported on 02/03/2017 07/20/15   Cindee Salt, MD  Multiple Vitamins-Minerals (MULTIVITAMIN WITH MINERALS) tablet Take 1 tablet by mouth daily.    [provider]  polyethylene glycol (MIRALAX / GLYCOLAX) packet Take 17 g by mouth daily.    [provider]  pseudoephedrine (SUDAFED) 120 MG 12 hr tablet Take 120 mg by mouth as needed for congestion.    [provider]  terbinafine (LAMISIL) 250 MG tablet TAKE 1 TABLET BY MOUTH ONCE A DAY 06/02/20 06/02/21  Ferman Hamming, DPM     Allergies    Patient has no known allergies.  Review of Systems   Review of Systems  Constitutional:  Negative for chills, diaphoresis and fever.  Respiratory:  Negative for cough.   Cardiovascular:  Positive for chest pain.  Gastrointestinal:  Negative for abdominal pain, diarrhea, nausea and vomiting.  Neurological:  Negative for dizziness, syncope, weakness and numbness.  All other systems reviewed and are negative.  Physical Exam Updated Vital Signs BP (!) 143/82 (BP Location: Right Arm)   Pulse 74   Temp 98.3 F (36.8 C) (Oral)   Resp (!) 21   Ht 5\' 5"  (1.651 m)   SpO2 100%   BMI 26.96 kg/m   Physical Exam Vitals and nursing note reviewed.  Constitutional:      General: She is not in acute distress.    Appearance: She is well-developed. She is not diaphoretic.  HENT:     Head: Normocephalic and atraumatic.     Mouth/Throat:     Mouth: Mucous membranes are moist.     Pharynx: Oropharynx is clear.  Eyes:     Conjunctiva/sclera: Conjunctivae normal.  Cardiovascular:     Rate and Rhythm: Normal rate and regular rhythm.     Pulses: Normal pulses.          Radial pulses are 2+ on the right side and 2+ on the left side.       Posterior tibial pulses are 2+ on the right side and 2+ on the left side.     Heart sounds: Normal heart sounds.     Comments: Tactile temperature in the extremities appropriate and equal bilaterally. Pulmonary:     Effort: Pulmonary effort is normal. No respiratory distress.     Breath sounds: Normal breath sounds.  Abdominal:     Palpations: Abdomen is soft.     Tenderness: There is no abdominal tenderness. There is no guarding.  Musculoskeletal:     Cervical back: Neck supple.     Right lower leg: No edema.     Left lower leg: No edema.  Skin:    General: Skin is warm and dry.  Neurological:     Mental Status: She is alert.  Psychiatric:        Mood and Affect: Mood and affect normal.        Speech: Speech normal.         Behavior: Behavior normal.    ED Results / Procedures / Treatments   Labs (all labs ordered are listed, but only abnormal results are displayed) Labs Reviewed  COMPREHENSIVE METABOLIC PANEL - Abnormal; Notable for the following components:      Result Value   Glucose, Bld 102 (*)    All other components within normal limits  CBC WITH DIFFERENTIAL/PLATELET - Abnormal; Notable for the following components:   RBC 5.35 (*)    Hemoglobin 11.6 (*)    MCV 68.2 (*)  MCH 21.7 (*)    RDW 15.8 (*)    All other components within normal limits  LIPASE, BLOOD  TROPONIN I (HIGH SENSITIVITY)  TROPONIN I (HIGH SENSITIVITY)    EKG EKG Interpretation  Date/Time:  Thursday December 14 2020 17:07:20 EDT Ventricular Rate:  76 PR Interval:  174 QRS Duration: 80 QT Interval:  376 QTC Calculation: 423 R Axis:   33 Text Interpretation: Normal sinus rhythm Normal ECG No old tracing to compare Confirmed by Jacalyn Lefevre [phone]) on 12/14/2020 6:44:55 PM  Radiology DG Chest 2 View  Result Date: 12/14/2020 CLINICAL DATA:  Chest pain. EXAM: CHEST - 2 VIEW COMPARISON:  None. FINDINGS: The cardiomediastinal contours are normal. The lungs are clear. Pulmonary vasculature is normal. No consolidation, pleural effusion, or pneumothorax. No acute osseous abnormalities are seen. IMPRESSION: Negative radiographs of the chest. Electronically Signed   By: Narda Rutherford M.D.   On: 12/14/2020 17:51    Procedures Procedures   Medications Ordered in ED Medications - No data to display  ED Course  I have reviewed the triage vital signs and the nursing notes.  Pertinent labs & imaging results that were available during my care of the patient were reviewed by me and considered in my medical decision making (see chart for details).  Clinical Course as of 12/14/20 2103  Thu Dec 14, 2020  2018 Patient endorses no changes, no worsening symptoms. [SJ]  2043 Hemoglobin(!): 11.6 No previous values with which to  compare. [SJ]    Clinical Course User Index [SJ] Eutimio Gharibian C, PA-C   MDM Rules/Calculators/A&P HEAR Score: 1                         Patient presents with complaint of chest pain intermittently over the last 3 weeks. Patient is nontoxic appearing, afebrile, not tachycardic, not tachypneic, not hypotensive, maintains excellent SPO2 on room air, and is in no apparent distress.  Low suspicion for ACS. No high risk/suspicious features; no exertional chest pain, vomiting, diaphoresis, or radiation. HEART score is 1, indicating low risk for a cardiac event.  EKG without evidence of acute ischemia or pathologic/symptomatic arrhythmia.  Delta troponins negative. Wells criteria score is 0, indicating low risk for PE.   Dissection was considered, but thought less likely base on: History and description of the pain are not suggestive, patient is not ill-appearing, lack of risk factors, equal bilateral pulses, lack of neurologic deficits, no widened mediastinum on chest x-ray.  I have reviewed the patient's chart to obtain more information.   I reviewed and interpreted the patient's labs and radiological studies. Work-up here overall reassuring.  Strict return precautions discussed.  Patient voices understanding of these instructions, accepts the plan, and is comfortable with discharge.    Vitals:   12/14/20 1930 12/14/20 2000 12/14/20 2030 12/14/20 2040  BP: (!) 143/84 (!) 147/84 139/80 (!) 143/82  Pulse: 63 72 69 74  Resp: 12 16 (!) 21 (!) 21  Temp:    98.3 F (36.8 C)  TempSrc:    Oral  SpO2: 98% 100% 98% 100%  Height:          Final Clinical Impression(s) / ED Diagnoses Final diagnoses:  Nonspecific chest pain    Rx / DC Orders ED Discharge Orders     None        Concepcion Living 12/14/20 2103    Jacalyn Lefevre, MD 12/14/20 2305

## 2020-12-14 NOTE — ED Notes (Signed)
Pt in bed, family at bedside, pt states that she is ready to go home.

## 2020-12-14 NOTE — ED Notes (Signed)
Pt ambulatory to er room number 16, pt reports chest pain for the past three weeks, denies swelling in legs, sob, or increased pain with deep breathing, talking in full sentences, resps even and unlabored.

## 2020-12-14 NOTE — Discharge Instructions (Signed)
Chest pain  Your work-up today was reassuring.   It would probably be a good idea to start taking an 81 mg aspirin daily.  The enteric-coated or "EC" versions are made to help avoid upset stomach when taking this medication.  Follow-up: For patients who experience episodes of chest pain, we recommend follow-up for evaluation by cardiology.  Call to make an appointment.  Return: Return to the emergency department for persistent or worsening chest pain, shortness of breath, dizziness, passing out, chest pain along with back or abdominal pain, weakness in the arms or legs, or any other major concerns.

## 2020-12-14 NOTE — ED Triage Notes (Signed)
Chest pain intermittent for 3 weeks

## 2020-12-27 ENCOUNTER — Other Ambulatory Visit (HOSPITAL_COMMUNITY): Payer: Self-pay

## 2020-12-27 DIAGNOSIS — Z131 Encounter for screening for diabetes mellitus: Secondary | ICD-10-CM | POA: Diagnosis not present

## 2020-12-27 DIAGNOSIS — R079 Chest pain, unspecified: Secondary | ICD-10-CM | POA: Diagnosis not present

## 2020-12-27 DIAGNOSIS — Z1389 Encounter for screening for other disorder: Secondary | ICD-10-CM | POA: Diagnosis not present

## 2020-12-27 DIAGNOSIS — D509 Iron deficiency anemia, unspecified: Secondary | ICD-10-CM | POA: Diagnosis not present

## 2020-12-27 DIAGNOSIS — Z1331 Encounter for screening for depression: Secondary | ICD-10-CM | POA: Diagnosis not present

## 2020-12-27 DIAGNOSIS — Z6826 Body mass index (BMI) 26.0-26.9, adult: Secondary | ICD-10-CM | POA: Diagnosis not present

## 2020-12-27 MED ORDER — METFORMIN HCL ER 500 MG PO TB24
500.0000 mg | ORAL_TABLET | Freq: Two times a day (BID) | ORAL | 1 refills | Status: DC
  Filled 2020-12-27: qty 60, 30d supply, fill #0

## 2020-12-27 NOTE — Progress Notes (Signed)
CARDIOLOGY CONSULT NOTE       Patient ID: Michele Snyder MRN: [id number] DOB/AGE: March 07, 1966 55 y.o.  Admit date: (Not on file) Referring Physician: Harolyn Rutherford PA C Primary Physician: [facility] Primary Cardiologist: New Reason for Consultation: Chest Pain  Active Problems:   * No active hospital problems. *   HPI:  55 y.o. referred by [facility] Harolyn Rutherford PA for chest pain seen there 12/14/20 Left sided heaviness sudden onset Not exertional She has no CRF;s History of GERD, migraines Pain feels like a gas bubble Occassionally with dyspnea  She r/o no acute ECG changes CXR NAD Hb 11.6 She's had atypical pain in past with normal stress echo in March 2014 Pain is clearly related to stress and some foods/coffee.   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 was at [employer] She has two older boys one in [place] one In [place] She spends time with her Celine Ahr but is sedentary    ROS All other systems reviewed and negative except as noted above  Past Medical History:  Diagnosis Date   Allergic rhinitis    GERD (gastroesophageal reflux disease)    Migraines     Family History  Problem Relation Age of Onset   Breast cancer Neg Hx     Social History   Socioeconomic History   Marital status: Married    Spouse name: Not on file   Number of children: Not on file   Years of education: Not on file   Highest education level: Not on file  Occupational History   Not on file  Tobacco Use   Smoking status: Never   Smokeless tobacco: Never  Substance and Sexual Activity   Alcohol use: No   Drug use: No   Sexual activity: Not on file  Other Topics Concern   Not on file  Social History Narrative   Not on file   Social Determinants of Health   Financial Resource Strain: Not on file  Food Insecurity: Not on file  Transportation Needs: Not on file  Physical Activity: Not on file  Stress: Not on file  Social Connections: Not on file  Intimate Partner  Violence: Not on file    Past Surgical History:  Procedure Laterality Date   ABDOMINAL HYSTERECTOMY  2008   CARPAL TUNNEL RELEASE Right 07/20/2015   Procedure: RIGHT CARPAL TUNNEL RELEASE;  Surgeon: Cindee Salt, MD;  Location: [facility];  Service: Orthopedics;  Laterality: Right;      Current Outpatient Medications:    aspirin EC 81 MG tablet, Take 81 mg by mouth daily. Swallow whole., Disp: , Rfl:    Multiple Vitamins-Minerals (MULTIVITAMIN WITH MINERALS) tablet, Take 1 tablet by mouth daily., Disp: , Rfl:    polyethylene glycol (MIRALAX / GLYCOLAX) packet, Take 17 g by mouth daily., Disp: , Rfl:     Physical Exam: Blood pressure 116/78, pulse 90, resp. rate 20, height 5\' 5"  (1.651 m), weight 68.9 kg, SpO2 98 %.   Affect appropriate Healthy:  appears stated age HEENT: normal Neck supple with no adenopathy JVP normal no bruits no thyromegaly Lungs clear with no wheezing and good diaphragmatic motion Heart:  S1/S2 no murmur, no rub, gallop or click PMI normal Abdomen: benighn, BS positve, no tenderness, no AAA no bruit.  No HSM or HJR Distal pulses intact with no bruits No edema Neuro non-focal Skin warm and dry No muscular weakness   Labs:   Lab Results  Component Value Date  WBC 6.6 12/14/2020   HGB 11.6 (L) 12/14/2020   HCT 36.5 12/14/2020   MCV 68.2 (L) 12/14/2020   PLT 259 12/14/2020   No results for input(s): NA, K, CL, CO2, BUN, CREATININE, CALCIUM, PROT, BILITOT, ALKPHOS, ALT, AST, GLUCOSE in the last 168 hours.  Invalid input(s): LABALBU No results found for: CKTOTAL, CKMB, CKMBINDEX, TROPONINI No results found for: CHOL No results found for: HDL No results found for: LDLCALC No results found for: TRIG No results found for: CHOLHDL No results found for: LDLDIRECT    Radiology: DG Chest 2 View  Result Date: 12/14/2020 CLINICAL DATA:  Chest pain. EXAM: CHEST - 2 VIEW COMPARISON:  None. FINDINGS: The cardiomediastinal contours are normal.  The lungs are clear. Pulmonary vasculature is normal. No consolidation, pleural effusion, or pneumothorax. No acute osseous abnormalities are seen. IMPRESSION: Negative radiographs of the chest. Electronically Signed   By: Narda Rutherford M.D.   On: 12/14/2020 17:51    EKG: NSR  rate 76 normal 12/15/20    ASSESSMENT AND PLAN:   1, Chest Pain: atypical no risk factors ER R/O favor coronary calcium score and no further testing if 0 If high for age and proceed with stress echo since there is one to compare with 2014 that was normal  2. GERD:  consider OTC Pepcid complete  3. Migraines :  stable    Calcium score F/U PRN if 0/low  Signed: Charlton Haws 01/02/2021, 1:48 PM

## 2020-12-28 ENCOUNTER — Other Ambulatory Visit (HOSPITAL_COMMUNITY): Payer: Self-pay

## 2021-01-02 ENCOUNTER — Encounter: Payer: Self-pay | Admitting: Cardiovascular Disease

## 2021-01-02 ENCOUNTER — Ambulatory Visit: Payer: 59 | Admitting: Cardiovascular Disease

## 2021-01-02 ENCOUNTER — Other Ambulatory Visit: Payer: Self-pay

## 2021-01-02 VITALS — BP 116/78 | HR 90 | Resp 20 | Ht 65.0 in | Wt 152.0 lb

## 2021-01-02 DIAGNOSIS — G43009 Migraine without aura, not intractable, without status migrainosus: Secondary | ICD-10-CM | POA: Diagnosis not present

## 2021-01-02 DIAGNOSIS — K219 Gastro-esophageal reflux disease without esophagitis: Secondary | ICD-10-CM | POA: Diagnosis not present

## 2021-01-02 DIAGNOSIS — R079 Chest pain, unspecified: Secondary | ICD-10-CM

## 2021-01-02 NOTE — Patient Instructions (Signed)
Medication Instructions:  Your physician recommends that you continue on your current medications as directed. Please refer to the Current Medication list given to you today.  *If you need a refill on your cardiac medications before your next appointment, please call your pharmacy*   Lab Work: NONE   If you have labs (blood work) drawn today and your tests are completely normal, you will receive your results only by: MyChart Message (if you have MyChart) OR A paper copy in the mail If you have any lab test that is abnormal or we need to change your treatment, we will call you to review the results.   Testing/Procedures: Calcium Score    Follow-Up: At [facility], you and your health needs are our priority.  As part of our continuing mission to provide you with exceptional heart care, we have created designated Provider Care Teams.  These Care Teams include your primary Cardiologist (physician) and Advanced Practice Providers (APPs -  Physician Assistants and Nurse Practitioners) who all work together to provide you with the care you need, when you need it.  We recommend signing up for the patient portal called "MyChart".  Sign up information is provided on this After Visit Summary.  MyChart is used to connect with patients for Virtual Visits (Telemedicine).  Patients are able to view lab/test results, encounter notes, upcoming appointments, etc.  Non-urgent messages can be sent to your provider as well.   To learn more about what you can do with MyChart, go to [email or web address].    Your next appointment:    As Needed   The format for your next appointment:   In Person  Provider:   Peter Nishan, MD   Other Instructions Thank you for choosing [facility]!    

## 2021-01-02 NOTE — Addendum Note (Signed)
Addended by: Kerney Elbe on: 01/02/2021 02:04 PM   Modules accepted: Orders

## 2021-01-31 DIAGNOSIS — D509 Iron deficiency anemia, unspecified: Secondary | ICD-10-CM | POA: Diagnosis not present

## 2021-01-31 DIAGNOSIS — R079 Chest pain, unspecified: Secondary | ICD-10-CM | POA: Diagnosis not present

## 2021-01-31 DIAGNOSIS — Z131 Encounter for screening for diabetes mellitus: Secondary | ICD-10-CM | POA: Diagnosis not present

## 2021-01-31 DIAGNOSIS — Z6825 Body mass index (BMI) 25.0-25.9, adult: Secondary | ICD-10-CM | POA: Diagnosis not present

## 2021-02-01 ENCOUNTER — Ambulatory Visit (HOSPITAL_COMMUNITY)
Admission: RE | Admit: 2021-02-01 | Discharge: 2021-02-01 | Disposition: A | Discharge status: Home / Self Care | Payer: Self-pay | Source: Ambulatory Visit | Attending: Cardiovascular Disease | Admitting: Cardiovascular Disease

## 2021-02-01 ENCOUNTER — Other Ambulatory Visit: Payer: Self-pay

## 2021-02-01 DIAGNOSIS — R079 Chest pain, unspecified: Secondary | ICD-10-CM | POA: Insufficient documentation

## 2021-02-09 ENCOUNTER — Other Ambulatory Visit: Payer: Self-pay | Admitting: Internal Medicine

## 2021-02-09 DIAGNOSIS — Z1231 Encounter for screening mammogram for malignant neoplasm of breast: Secondary | ICD-10-CM

## 2021-02-14 ENCOUNTER — Other Ambulatory Visit: Payer: Self-pay

## 2021-02-14 ENCOUNTER — Ambulatory Visit
Admission: RE | Admit: 2021-02-14 | Discharge: 2021-02-14 | Disposition: A | Discharge status: Home / Self Care | Payer: 59 | Source: Ambulatory Visit | Attending: Internal Medicine | Admitting: Internal Medicine

## 2021-02-14 DIAGNOSIS — Z1231 Encounter for screening mammogram for malignant neoplasm of breast: Secondary | ICD-10-CM

## 2021-04-05 DIAGNOSIS — R7303 Prediabetes: Secondary | ICD-10-CM | POA: Diagnosis not present

## 2021-04-05 DIAGNOSIS — D509 Iron deficiency anemia, unspecified: Secondary | ICD-10-CM | POA: Diagnosis not present

## 2021-04-05 DIAGNOSIS — R079 Chest pain, unspecified: Secondary | ICD-10-CM | POA: Diagnosis not present

## 2021-04-05 DIAGNOSIS — Z6825 Body mass index (BMI) 25.0-25.9, adult: Secondary | ICD-10-CM | POA: Diagnosis not present

## 2021-04-06 ENCOUNTER — Other Ambulatory Visit (HOSPITAL_COMMUNITY): Payer: Self-pay

## 2021-04-06 MED ORDER — METFORMIN HCL ER 500 MG PO TB24
500.0000 mg | ORAL_TABLET | Freq: Every day | ORAL | 1 refills | Status: DC
  Filled 2021-04-06: qty 30, 30d supply, fill #0

## 2021-04-09 ENCOUNTER — Other Ambulatory Visit (HOSPITAL_COMMUNITY): Payer: Self-pay

## 2021-04-09 MED ORDER — METFORMIN HCL ER 500 MG PO TB24
500.0000 mg | ORAL_TABLET | Freq: Two times a day (BID) | ORAL | 1 refills | Status: DC
  Filled 2021-04-09: qty 60, 30d supply, fill #0

## 2021-04-21 DIAGNOSIS — D649 Anemia, unspecified: Secondary | ICD-10-CM | POA: Diagnosis not present

## 2021-05-01 ENCOUNTER — Other Ambulatory Visit (HOSPITAL_COMMUNITY): Payer: Self-pay

## 2021-05-01 DIAGNOSIS — R7303 Prediabetes: Secondary | ICD-10-CM | POA: Diagnosis not present

## 2021-05-01 DIAGNOSIS — R079 Chest pain, unspecified: Secondary | ICD-10-CM | POA: Diagnosis not present

## 2021-05-01 DIAGNOSIS — D509 Iron deficiency anemia, unspecified: Secondary | ICD-10-CM | POA: Diagnosis not present

## 2021-05-01 DIAGNOSIS — Z6825 Body mass index (BMI) 25.0-25.9, adult: Secondary | ICD-10-CM | POA: Diagnosis not present

## 2021-05-01 MED ORDER — METFORMIN HCL ER 500 MG PO TB24
500.0000 mg | ORAL_TABLET | Freq: Two times a day (BID) | ORAL | 1 refills | Status: DC
  Filled 2021-05-01: qty 180, 90d supply, fill #0

## 2021-05-02 ENCOUNTER — Other Ambulatory Visit (HOSPITAL_COMMUNITY): Payer: Self-pay

## 2021-05-03 ENCOUNTER — Other Ambulatory Visit (HOSPITAL_COMMUNITY): Payer: Self-pay

## 2021-05-09 ENCOUNTER — Encounter: Payer: Self-pay | Admitting: Internal Medicine

## 2021-05-14 ENCOUNTER — Other Ambulatory Visit (HOSPITAL_COMMUNITY): Payer: Self-pay

## 2021-05-24 ENCOUNTER — Encounter: Payer: Self-pay | Admitting: Gastroenterology

## 2021-06-05 DIAGNOSIS — H2513 Age-related nuclear cataract, bilateral: Secondary | ICD-10-CM | POA: Diagnosis not present

## 2021-06-05 DIAGNOSIS — H524 Presbyopia: Secondary | ICD-10-CM | POA: Diagnosis not present

## 2021-06-05 DIAGNOSIS — H5203 Hypermetropia, bilateral: Secondary | ICD-10-CM | POA: Diagnosis not present

## 2021-06-18 ENCOUNTER — Ambulatory Visit (INDEPENDENT_AMBULATORY_CARE_PROVIDER_SITE_OTHER): Payer: 59 | Admitting: Primary Care

## 2021-07-31 DIAGNOSIS — D649 Anemia, unspecified: Secondary | ICD-10-CM | POA: Diagnosis not present

## 2021-08-13 DIAGNOSIS — R7303 Prediabetes: Secondary | ICD-10-CM | POA: Diagnosis not present

## 2021-08-13 DIAGNOSIS — D509 Iron deficiency anemia, unspecified: Secondary | ICD-10-CM | POA: Diagnosis not present

## 2021-08-13 DIAGNOSIS — Z6824 Body mass index (BMI) 24.0-24.9, adult: Secondary | ICD-10-CM | POA: Diagnosis not present

## 2021-08-13 DIAGNOSIS — R079 Chest pain, unspecified: Secondary | ICD-10-CM | POA: Diagnosis not present

## 2021-08-13 DIAGNOSIS — Z6825 Body mass index (BMI) 25.0-25.9, adult: Secondary | ICD-10-CM | POA: Diagnosis not present

## 2021-08-23 ENCOUNTER — Other Ambulatory Visit (HOSPITAL_COMMUNITY): Payer: Self-pay

## 2021-08-24 ENCOUNTER — Ambulatory Visit: Payer: 59 | Admitting: Gastroenterology

## 2021-08-31 ENCOUNTER — Other Ambulatory Visit (HOSPITAL_COMMUNITY): Payer: Self-pay

## 2021-09-17 ENCOUNTER — Other Ambulatory Visit (HOSPITAL_COMMUNITY): Payer: Self-pay

## 2021-09-17 ENCOUNTER — Encounter: Payer: Self-pay | Admitting: Gastroenterology

## 2021-09-17 ENCOUNTER — Other Ambulatory Visit: Payer: Self-pay

## 2021-09-17 ENCOUNTER — Ambulatory Visit (INDEPENDENT_AMBULATORY_CARE_PROVIDER_SITE_OTHER): Payer: 59 | Admitting: Gastroenterology

## 2021-09-17 DIAGNOSIS — D509 Iron deficiency anemia, unspecified: Secondary | ICD-10-CM | POA: Diagnosis not present

## 2021-09-17 MED ORDER — CLENPIQ 10-3.5-12 MG-GM -GM/175ML PO SOLN
1.0000 | Freq: Once | ORAL | 0 refills | Status: AC
  Filled 2021-09-17 – 2021-09-20 (×2): qty 350, 1d supply, fill #0

## 2021-09-17 NOTE — Progress Notes (Signed)
? ? ? ?GI Office Note   ? ?Referring Provider: [facility] ?Primary Care Physician:  [facility] , Gordon Williams, MD ?Primary Gastroenterologist: Charles K. Carver, DO ? ?Chief Complaint  ? ?Chief Complaint  ?Patient presents with  ? Anemia  ? Colonoscopy  ? ? ?History of Present Illness  ? ?Michele Snyder is a 55 y.o. female presenting today at the request of Dr. Gordon Williams for further evaluation of anemia and colonoscopy. ? ?Last colonoscopy about 7 years ago, at [facility], unremarkable per patient.  Patient has a history of anemia, she reports going back for some time and was told she was iron deficient.  She used to be on iron off and on but she eventually stopped because it was difficult to tolerate in the setting of chronic constipation.  All of her life she has had issues with infrequent stools, going up to 2 to 3 weeks at a time without a BM.  Fortunately she has been able to manage her constipation with Miralax/Metamucil. BM daily, sometimes twice. Stools are soft. No melena, brbpr.  Drinks plenty of water. Stomach bubbling/noisy all the time. No abdominal pain. No heartburn, dysphagia. No n/v.  ? ?She has a history of atypical chest pain, evaluated by cardiology last year.  Felt to be low risk for ischemia.  CT cardiac scoring unremarkable.  Follows as needed only.  At times she will take aspirin off and on for good measures.  Rarely has any chest discomfort.  She feels like her symptoms were likely indigestion.  Only 2 episodes since last September. ?  ?  ? ? ?Medications  ? ?Current Outpatient Medications  ?Medication Sig Dispense Refill  ? aspirin EC 81 MG tablet Take 81 mg by mouth daily. Swallow whole.    ? polyethylene glycol (MIRALAX / GLYCOLAX) packet Take 17 g by mouth daily.    ? Psyllium (METAMUCIL PO) Take by mouth. 1 gummy daily    ? ?No current facility-administered medications for this visit.  ? ? ?Allergies  ? ?Allergies as of 09/17/2021  ? (No Known  Allergies)  ? ? ?Past Medical History  ? ?Past Medical History:  ?Diagnosis Date  ? Allergic rhinitis   ? GERD (gastroesophageal reflux disease)   ? Migraines   ? ? ?Past Surgical History  ? ?Past Surgical History:  ?Procedure Laterality Date  ? ABDOMINAL HYSTERECTOMY  2008  ? CARPAL TUNNEL RELEASE Right 07/20/2015  ? Procedure: RIGHT CARPAL TUNNEL RELEASE;  Surgeon: Gary Kuzma, MD;  Location: [facility];  Service: Orthopedics;  Laterality: Right;  ? ? ?Past Family History  ? ?Family History  ?Problem Relation Age of Onset  ? High blood pressure Mother   ? High blood pressure Father   ? Other Sister   ?     bad COVID, HTN  ? Colon cancer Other   ? Other Other   ?     no Sickle Cell, Thallasemia, anemia issues  ? Breast cancer Neg Hx   ? ? ?Past Social History  ? ?Social History  ? ?Socioeconomic History  ? Marital status: Married  ?  Spouse name: Not on file  ? Number of children: Not on file  ? Years of education: Not on file  ? Highest education level: Not on file  ?Occupational History  ? Not on file  ?Tobacco Use  ? Smoking status: Never  ? Smokeless tobacco: Never  ?Substance and Sexual Activity  ? Alcohol use: Yes  ?    Comment: occ  ? Drug use: No  ? Sexual activity: Not Currently  ?Other Topics Concern  ? Not on file  ?Social History Narrative  ? Works at [employer], sterile processing.  ? ?Social Determinants of Health  ? ?Financial Resource Strain: Not on file  ?Food Insecurity: Not on file  ?Transportation Needs: Not on file  ?Physical Activity: Not on file  ?Stress: Not on file  ?Social Connections: Not on file  ?Intimate Partner Violence: Not on file  ? ? ?Review of Systems  ? ?General: Negative for anorexia, weight loss, fever, chills, fatigue, weakness. ?Eyes: Negative for vision changes.  ?ENT: Negative for hoarseness, difficulty swallowing , nasal congestion. ?CV: Negative for chest pain, angina, palpitations, dyspnea on exertion, peripheral edema.  ?Respiratory: Negative for  dyspnea at rest, dyspnea on exertion, cough, sputum, wheezing.  ?GI: See history of present illness. ?GU:  Negative for dysuria, hematuria, urinary incontinence, urinary frequency, nocturnal urination.  ?MS: Negative for joint pain, low back pain.  ?Derm: Negative for rash or itching.  ?Neuro: Negative for weakness, abnormal sensation, seizure, frequent headaches, memory loss,  ?confusion.  ?Psych: Negative for anxiety, depression, suicidal ideation, hallucinations.  ?Endo: Negative for unusual weight change.  ?Heme: Negative for bruising or bleeding. ?Allergy: Negative for rash or hives. ? ?Physical Exam  ? ?BP 122/80 (BP Location: Right Arm, Patient Position: Sitting, Cuff Size: Normal)   Pulse 76   Temp 97.8 ?F (36.6 ?C) (Temporal)   Ht 5\' 5"  (1.651 m)   Wt 147 lb (66.7 kg)   SpO2 98%   BMI 24.46 kg/m?  ?  ?General: Well-nourished, well-developed in no acute distress.  ?Head: Normocephalic, atraumatic.   ?Eyes: Conjunctiva pink, no icterus. ?Mouth: Oropharyngeal mucosa moist and pink , no lesions erythema or exudate. ?Neck: Supple without thyromegaly, masses, or lymphadenopathy.  ?Lungs: Clear to auscultation bilaterally.  ?Heart: Regular rate and rhythm, no murmurs rubs or gallops.  ?Abdomen: Bowel sounds are normal, nontender, nondistended, no hepatosplenomegaly or masses,  ?no abdominal bruits or hernia, no rebound or guarding.   ?Rectal: not performed ?Extremities: No lower extremity edema. No clubbing or deformities.  ?Neuro: Alert and oriented x 4 , grossly normal neurologically.  ?Skin: Warm and dry, no rash or jaundice.   ?Psych: Alert and cooperative, normal mood and affect. ? ?Labs  ? ?Labs dated 07/31/2021: White blood cell count 4700, hemoglobin 12, MCV 72, platelets 296,000. ? ?Labs dated 04/21/2021: Hemoglobin 11.6, hematocrit 38.7, MCV 70 ? ?Labs dated 12/27/2020: Hemoglobin 11.9, hematocrit 39.3, MCV 68, iron 71, iron saturation is 20%, TIBC 355, Folate 12.4, B12 997, ferritin 74. ? ?Lab Results   ?Component Value Date  ? CREATININE 0.73 12/14/2020  ? BUN 17 12/14/2020  ? NA 138 12/14/2020  ? K 3.8 12/14/2020  ? CL 103 12/14/2020  ? CO2 27 12/14/2020  ? ?Lab Results  ?Component Value Date  ? WBC 6.6 12/14/2020  ? HGB 11.6 (L) 12/14/2020  ? HCT 36.5 12/14/2020  ? MCV 68.2 (L) 12/14/2020  ? PLT 259 12/14/2020  ? ?Lab Results  ?Component Value Date  ? ALT 13 12/14/2020  ? AST 16 12/14/2020  ? ALKPHOS 42 12/14/2020  ? BILITOT 0.6 12/14/2020  ? ?Lab Results  ?Component Value Date  ? LIPASE 35 12/14/2020  ? ? ? ?Imaging Studies  ? ?No results found. ? ?Assessment  ? ?Very pleasant 56 year old female who reports negative colonoscopy at age 8, presenting for further evaluation of mild microcytic anemia.  Presents specifically  for colonoscopy at the request of her PCP. ? ?Microcytic anemia: Patient gives history of iron deficiency for years, used to take iron off and on but not lately because of GI intolerance/worsening constipation.  Chronically MCV has been low in the 60 range.  This could be related to iron deficiency or could be her baseline/genetics.  Would recommend updating colonoscopy given last one was 7 years ago. ? ?Chronic constipation: Well managed on MiraLAX and Metamucil. ? ? ?PLAN  ? ?Colonoscopy in the near future.  ASA 1.  I have discussed the risks, alternatives, benefits with regards to but not limited to the risk of reaction to medication, bleeding, infection, perforation and the patient is agreeable to proceed. Written consent to be obtained. ?Continue daily MiraLAX/Metamucil. ?Hold off on iron supplementation given recent iron/ferritin values. ? ? ?Leanna Battles. Donyea Gafford, MHS, PA-C ?[facility] ? ?

## 2021-09-17 NOTE — H&P (View-Only) (Signed)
? ? ? ?GI Office Note   ? ?Referring Provider: [facility] ?Primary Care Physician:  [facility] , Mitzi Hansen, MD ?Primary Gastroenterologist: Hennie Duos. Marletta Lor, DO ? ?Chief Complaint  ? ?Chief Complaint  ?Patient presents with  ? Anemia  ? Colonoscopy  ? ? ?History of Present Illness  ? ?Michele Snyder is a 56 y.o. female presenting today at the request of Dr. Mitzi Hansen for further evaluation of anemia and colonoscopy. ? ?Last colonoscopy about 7 years ago, at [facility], unremarkable per patient.  Patient has a history of anemia, she reports going back for some time and was told she was iron deficient.  She used to be on iron off and on but she eventually stopped because it was difficult to tolerate in the setting of chronic constipation.  All of her life she has had issues with infrequent stools, going up to 2 to 3 weeks at a time without a BM.  Fortunately she has been able to manage her constipation with Miralax/Metamucil. BM daily, sometimes twice. Stools are soft. No melena, brbpr.  Drinks plenty of water. Stomach bubbling/noisy all the time. No abdominal pain. No heartburn, dysphagia. No n/v.  ? ?She has a history of atypical chest pain, evaluated by cardiology last year.  Felt to be low risk for ischemia.  CT cardiac scoring unremarkable.  Follows as needed only.  At times she will take aspirin off and on for good measures.  Rarely has any chest discomfort.  She feels like her symptoms were likely indigestion.  Only 2 episodes since last September. ?  ?  ? ? ?Medications  ? ?Current Outpatient Medications  ?Medication Sig Dispense Refill  ? aspirin EC 81 MG tablet Take 81 mg by mouth daily. Swallow whole.    ? polyethylene glycol (MIRALAX / GLYCOLAX) packet Take 17 g by mouth daily.    ? Psyllium (METAMUCIL PO) Take by mouth. 1 gummy daily    ? ?No current facility-administered medications for this visit.  ? ? ?Allergies  ? ?Allergies as of 09/17/2021  ? (No Known  Allergies)  ? ? ?Past Medical History  ? ?Past Medical History:  ?Diagnosis Date  ? Allergic rhinitis   ? GERD (gastroesophageal reflux disease)   ? Migraines   ? ? ?Past Surgical History  ? ?Past Surgical History:  ?Procedure Laterality Date  ? ABDOMINAL HYSTERECTOMY  2008  ? CARPAL TUNNEL RELEASE Right 07/20/2015  ? Procedure: RIGHT CARPAL TUNNEL RELEASE;  Surgeon: Cindee Salt, MD;  Location: [facility];  Service: Orthopedics;  Laterality: Right;  ? ? ?Past Family History  ? ?Family History  ?Problem Relation Age of Onset  ? High blood pressure Mother   ? High blood pressure Father   ? Other Sister   ?     bad COVID, HTN  ? Colon cancer Other   ? Other Other   ?     no Sickle Cell, Thallasemia, anemia issues  ? Breast cancer Neg Hx   ? ? ?Past Social History  ? ?Social History  ? ?Socioeconomic History  ? Marital status: Married  ?  Spouse name: Not on file  ? Number of children: Not on file  ? Years of education: Not on file  ? Highest education level: Not on file  ?Occupational History  ? Not on file  ?Tobacco Use  ? Smoking status: Never  ? Smokeless tobacco: Never  ?Substance and Sexual Activity  ? Alcohol use: Yes  ?  Comment: occ  ? Drug use: No  ? Sexual activity: Not Currently  ?Other Topics Concern  ? Not on file  ?Social History Narrative  ? Works at [employer], sterile processing.  ? ?Social Determinants of Health  ? ?Financial Resource Strain: Not on file  ?Food Insecurity: Not on file  ?Transportation Needs: Not on file  ?Physical Activity: Not on file  ?Stress: Not on file  ?Social Connections: Not on file  ?Intimate Partner Violence: Not on file  ? ? ?Review of Systems  ? ?General: Negative for anorexia, weight loss, fever, chills, fatigue, weakness. ?Eyes: Negative for vision changes.  ?ENT: Negative for hoarseness, difficulty swallowing , nasal congestion. ?CV: Negative for chest pain, angina, palpitations, dyspnea on exertion, peripheral edema.  ?Respiratory: Negative for  dyspnea at rest, dyspnea on exertion, cough, sputum, wheezing.  ?GI: See history of present illness. ?GU:  Negative for dysuria, hematuria, urinary incontinence, urinary frequency, nocturnal urination.  ?MS: Negative for joint pain, low back pain.  ?Derm: Negative for rash or itching.  ?Neuro: Negative for weakness, abnormal sensation, seizure, frequent headaches, memory loss,  ?confusion.  ?Psych: Negative for anxiety, depression, suicidal ideation, hallucinations.  ?Endo: Negative for unusual weight change.  ?Heme: Negative for bruising or bleeding. ?Allergy: Negative for rash or hives. ? ?Physical Exam  ? ?BP 122/80 (BP Location: Right Arm, Patient Position: Sitting, Cuff Size: Normal)   Pulse 76   Temp 97.8 ?F (36.6 ?C) (Temporal)   Ht 5\' 5"  (1.651 m)   Wt 147 lb (66.7 kg)   SpO2 98%   BMI 24.46 kg/m?  ?  ?General: Well-nourished, well-developed in no acute distress.  ?Head: Normocephalic, atraumatic.   ?Eyes: Conjunctiva pink, no icterus. ?Mouth: Oropharyngeal mucosa moist and pink , no lesions erythema or exudate. ?Neck: Supple without thyromegaly, masses, or lymphadenopathy.  ?Lungs: Clear to auscultation bilaterally.  ?Heart: Regular rate and rhythm, no murmurs rubs or gallops.  ?Abdomen: Bowel sounds are normal, nontender, nondistended, no hepatosplenomegaly or masses,  ?no abdominal bruits or hernia, no rebound or guarding.   ?Rectal: not performed ?Extremities: No lower extremity edema. No clubbing or deformities.  ?Neuro: Alert and oriented x 4 , grossly normal neurologically.  ?Skin: Warm and dry, no rash or jaundice.   ?Psych: Alert and cooperative, normal mood and affect. ? ?Labs  ? ?Labs dated 07/31/2021: White blood cell count 4700, hemoglobin 12, MCV 72, platelets 296,000. ? ?Labs dated 04/21/2021: Hemoglobin 11.6, hematocrit 38.7, MCV 70 ? ?Labs dated 12/27/2020: Hemoglobin 11.9, hematocrit 39.3, MCV 68, iron 71, iron saturation is 20%, TIBC 355, Folate 12.4, B12 997, ferritin 74. ? ?Lab Results   ?Component Value Date  ? CREATININE 0.73 12/14/2020  ? BUN 17 12/14/2020  ? NA 138 12/14/2020  ? K 3.8 12/14/2020  ? CL 103 12/14/2020  ? CO2 27 12/14/2020  ? ?Lab Results  ?Component Value Date  ? WBC 6.6 12/14/2020  ? HGB 11.6 (L) 12/14/2020  ? HCT 36.5 12/14/2020  ? MCV 68.2 (L) 12/14/2020  ? PLT 259 12/14/2020  ? ?Lab Results  ?Component Value Date  ? ALT 13 12/14/2020  ? AST 16 12/14/2020  ? ALKPHOS 42 12/14/2020  ? BILITOT 0.6 12/14/2020  ? ?Lab Results  ?Component Value Date  ? LIPASE 35 12/14/2020  ? ? ? ?Imaging Studies  ? ?No results found. ? ?Assessment  ? ?Very pleasant 56 year old female who reports negative colonoscopy at age 8, presenting for further evaluation of mild microcytic anemia.  Presents specifically  for colonoscopy at the request of her PCP. ? ?Microcytic anemia: Patient gives history of iron deficiency for years, used to take iron off and on but not lately because of GI intolerance/worsening constipation.  Chronically MCV has been low in the 60 range.  This could be related to iron deficiency or could be her baseline/genetics.  Would recommend updating colonoscopy given last one was 7 years ago. ? ?Chronic constipation: Well managed on MiraLAX and Metamucil. ? ? ?PLAN  ? ?Colonoscopy in the near future.  ASA 1.  I have discussed the risks, alternatives, benefits with regards to but not limited to the risk of reaction to medication, bleeding, infection, perforation and the patient is agreeable to proceed. Written consent to be obtained. ?Continue daily MiraLAX/Metamucil. ?Hold off on iron supplementation given recent iron/ferritin values. ? ? ?Leanna Battles. Kashton Mcartor, MHS, PA-C ?[facility] ? ?

## 2021-09-17 NOTE — Patient Instructions (Addendum)
?  Continue miralax and metamucil daily to keep your bowels moving regular.  ?Colonoscopy to be scheduled. See separate instructions.  ?

## 2021-09-18 ENCOUNTER — Other Ambulatory Visit (HOSPITAL_COMMUNITY): Payer: Self-pay

## 2021-09-19 ENCOUNTER — Other Ambulatory Visit (HOSPITAL_COMMUNITY): Payer: Self-pay

## 2021-09-20 ENCOUNTER — Other Ambulatory Visit (HOSPITAL_COMMUNITY): Payer: Self-pay

## 2021-09-28 ENCOUNTER — Encounter (HOSPITAL_COMMUNITY)
Admission: RE | Disposition: A | Discharge status: Home / Self Care | Payer: Self-pay | Source: Home / Self Care | Attending: Internal Medicine

## 2021-09-28 ENCOUNTER — Other Ambulatory Visit: Payer: Self-pay

## 2021-09-28 ENCOUNTER — Ambulatory Visit (HOSPITAL_BASED_OUTPATIENT_CLINIC_OR_DEPARTMENT_OTHER): Payer: 59 | Admitting: Certified Registered"

## 2021-09-28 ENCOUNTER — Ambulatory Visit (HOSPITAL_COMMUNITY)
Admission: RE | Admit: 2021-09-28 | Discharge: 2021-09-28 | Disposition: A | Discharge status: Home / Self Care | Payer: 59 | Attending: Internal Medicine | Admitting: Internal Medicine

## 2021-09-28 ENCOUNTER — Encounter (HOSPITAL_COMMUNITY): Payer: Self-pay

## 2021-09-28 ENCOUNTER — Ambulatory Visit (HOSPITAL_COMMUNITY): Payer: 59 | Admitting: Certified Registered"

## 2021-09-28 DIAGNOSIS — K635 Polyp of colon: Secondary | ICD-10-CM

## 2021-09-28 DIAGNOSIS — K219 Gastro-esophageal reflux disease without esophagitis: Secondary | ICD-10-CM | POA: Diagnosis not present

## 2021-09-28 DIAGNOSIS — K648 Other hemorrhoids: Secondary | ICD-10-CM

## 2021-09-28 DIAGNOSIS — F419 Anxiety disorder, unspecified: Secondary | ICD-10-CM | POA: Diagnosis not present

## 2021-09-28 DIAGNOSIS — D509 Iron deficiency anemia, unspecified: Secondary | ICD-10-CM | POA: Insufficient documentation

## 2021-09-28 HISTORY — PX: COLONOSCOPY WITH PROPOFOL: SHX5780

## 2021-09-28 SURGERY — COLONOSCOPY WITH PROPOFOL
Anesthesia: General

## 2021-09-28 MED ORDER — LACTATED RINGERS IV SOLN: INTRAVENOUS | Status: DC

## 2021-09-28 MED ORDER — LIDOCAINE 2% (20 MG/ML) 5 ML SYRINGE
INTRAMUSCULAR | Status: DC | PRN
Start: 2021-09-28 — End: 2021-09-28
  Administered 2021-09-28: 50 mg via INTRAVENOUS

## 2021-09-28 MED ORDER — PROPOFOL 500 MG/50ML IV EMUL
INTRAVENOUS | Status: DC | PRN
  Administered 2021-09-28: 200 ug/kg/min via INTRAVENOUS

## 2021-09-28 MED ORDER — PROPOFOL 10 MG/ML IV BOLUS
INTRAVENOUS | Status: DC | PRN
  Administered 2021-09-28: 80 mg via INTRAVENOUS

## 2021-09-28 NOTE — Anesthesia Preprocedure Evaluation (Addendum)
Anesthesia Evaluation  ?Patient identified by MRN, date of birth, ID band ?Patient awake ? ? ? ?Reviewed: ?Allergy & Precautions, NPO status , Patient's Chart, lab work & pertinent test results ? ?Airway ?Mallampati: II ? ?TM Distance: >3 FB ?Neck ROM: Full ? ? ? Dental ? ?(+) Dental Advisory Given ?No notable dental injury:   ?Pulmonary ?neg pulmonary ROS,  ?  ?Pulmonary exam normal ?breath sounds clear to auscultation ? ? ? ? ? ? Cardiovascular ?negative cardio ROS ?Normal cardiovascular exam ?Rhythm:Regular Rate:Normal ? ? ?  ?Neuro/Psych ? Headaches, PSYCHIATRIC DISORDERS Anxiety   ? GI/Hepatic ?Neg liver ROS, GERD  Controlled,  ?Endo/Other  ?negative endocrine ROS ? Renal/GU ?negative Renal ROS  ?negative genitourinary ?  ?Musculoskeletal ?negative musculoskeletal ROS ?(+)  ? Abdominal ?  ?Peds ?negative pediatric ROS ?(+)  Hematology ? ?(+) Blood dyscrasia, anemia ,   ?Anesthesia Other Findings ? ? Reproductive/Obstetrics ?negative OB ROS ? ?  ? ? ? ? ? ? ? ? ? ? ? ? ? ?  ?  ? ? ? ? ? ? ? ?Anesthesia Physical ?Anesthesia Plan ? ?ASA: 1 ? ?Anesthesia Plan: General  ? ?Post-op Pain Management: Minimal or no pain anticipated  ? ?Induction: Intravenous ? ?PONV Risk Score and Plan: Propofol infusion ? ?Airway Management Planned: Nasal Cannula and Natural Airway ? ?Additional Equipment:  ? ?Intra-op Plan:  ? ?Post-operative Plan:  ? ?Informed Consent: I have reviewed the patients History and Physical, chart, labs and discussed the procedure including the risks, benefits and alternatives for the proposed anesthesia with the patient or authorized representative who has indicated his/her understanding and acceptance.  ? ? ? ?Dental advisory given ? ?Plan Discussed with: CRNA and Surgeon ? ?Anesthesia Plan Comments:   ? ? ? ? ? ?Anesthesia Quick Evaluation ? ?

## 2021-09-28 NOTE — Interval H&P Note (Signed)
History and Physical Interval Note: ? ?09/28/2021 ?11:47 AM ? ?Michele Snyder  has presented today for surgery, with the diagnosis of microcytic anemia.  The various methods of treatment have been discussed with the patient and family. After consideration of risks, benefits and other options for treatment, the patient has consented to  Procedure(s) with comments: ?COLONOSCOPY WITH PROPOFOL (N/A) - 1:00pm as a surgical intervention.  The patient's history has been reviewed, patient examined, no change in status, stable for surgery.  I have reviewed the patient's chart and labs.  Questions were answered to the patient's satisfaction.   ? ? ?[name] ? ? ?

## 2021-09-28 NOTE — Transfer of Care (Signed)
Immediate Anesthesia Transfer of Care Note ? ?Patient: Michele Snyder ? ?Procedure(s) Performed: COLONOSCOPY WITH PROPOFOL ? ?Patient Location: Endoscopy Unit ? ?Anesthesia Type:MAC ? ?Level of Consciousness: sedated and patient cooperative ? ?Airway & Oxygen Therapy: Patient Spontanous Breathing ? ?Post-op Assessment: Report given to RN and Post -op Vital signs reviewed and stable ? ?Post vital signs: Reviewed and stable ? ?Last Vitals:  ?Vitals Value Taken Time  ?BP    ?Temp    ?Pulse    ?Resp    ?SpO2    ? ? ?Last Pain:  ?Vitals:  ? 09/28/21 1213  ?TempSrc:   ?PainSc: 0-No pain  ?   ? ?Patients Stated Pain Goal: 8 (09/28/21 1138) ? ?Complications: No notable events documented. ?

## 2021-09-28 NOTE — Op Note (Signed)
[facility] ?Patient Name: Michele Snyder ?Procedure Date: 09/28/2021 12:09 PM ?MRN: [id number] ?Date of Birth: [birth year] ?Attending MD: Elon Alas. Abbey Chatters , DO ?CSN: [id number] ?Age: 56 ?Admit Type: Outpatient ?Procedure:                Colonoscopy ?Indications:              Iron deficiency anemia ?Providers:                Elon Alas. Abbey Chatters, DO, Charlsie Quest. Theda Sers RN, Therapist, sports,  ?                          Casimer Bilis, Technician ?Referring MD:              ?Medicines:                See the Anesthesia note for documentation of the  ?                          administered medications ?Complications:            No immediate complications. ?Estimated Blood Loss:     Estimated blood loss was minimal. ?Procedure:                Pre-Anesthesia Assessment: ?                          - The anesthesia plan was to use monitored  ?                          anesthesia care (MAC). ?                          After obtaining informed consent, the colonoscope  ?                          was passed under direct vision. Throughout the  ?                          procedure, the patient's blood pressure, pulse, and  ?                          oxygen saturations were monitored continuously. The  ?                          PCF-HQ190L ([id number]) scope was introduced through  ?                          the anus and advanced to the the cecum, identified  ?                          by appendiceal orifice and ileocecal valve. The  ?                          colonoscopy was performed without difficulty. The  ?                          patient tolerated the procedure well.  The quality  ?                          of the bowel preparation was evaluated using the  ?                          BBPS [facility] Bowel Preparation Scale) with scores  ?                          of: Right Colon = 3, Transverse Colon = 3 and Left  ?                          Colon = 3 (entire mucosa seen well with no residual  ?                          staining, small  fragments of stool or opaque  ?                          liquid). The total BBPS score equals 9. ?Scope In: 12:14:13 PM ?Scope Out: 12:24:58 PM ?Scope Withdrawal Time: 0 hours 8 minutes 54 seconds  ?Total Procedure Duration: 0 hours 10 minutes 45 seconds  ?Findings: ?     The perianal and digital rectal examinations were normal. ?     Non-bleeding internal hemorrhoids were found during endoscopy. ?     A 3 mm polyp was found in the transverse colon. The polyp was sessile.  ?     The polyp was removed with a cold snare. Resection and retrieval were  ?     complete. ?     The terminal ileum appeared normal. ?     The exam was otherwise without abnormality. ?Impression:               - Non-bleeding internal hemorrhoids. ?                          - One 3 mm polyp in the transverse colon, removed  ?                          with a cold snare. Resected and retrieved. ?                          - The examination was otherwise normal. ?Moderate Sedation: ?     Per Anesthesia Care ?Recommendation:           - Patient has a contact number available for  ?                          emergencies. The signs and symptoms of potential  ?                          delayed complications were discussed with the  ?                          patient. Return to normal activities tomorrow.  ?  Written discharge instructions were provided to the  ?                          patient. ?                          - Resume previous diet. ?                          - Continue present medications. ?                          - Await pathology results. ?                          - Repeat colonoscopy in 5-10 years for surveillance. ?                          - Return to GI clinic in 6 months. ?Procedure Code(s):        --- Professional --- ?                          [phone], Colonoscopy, flexible; with removal of  ?                          tumor(s), polyp(s), or other lesion(s) by snare  ?                           technique ?Diagnosis Code(s):        --- Professional --- ?                          K63.5, Polyp of colon ?                          K64.8, Other hemorrhoids ?                          D50.9, Iron deficiency anemia, unspecified ?CPT copyright 2019 [employer]. All rights reserved. ?The codes documented in this report are preliminary and upon coder review may  ?be revised to meet current compliance requirements. ?Elon Alas. Abbey Chatters, DO ?Elon Alas. Dundarrach, DO ?09/28/2021 03:50:09 PM ?This report has been signed electronically. ?Number of Addenda: 0 ?

## 2021-09-28 NOTE — Anesthesia Postprocedure Evaluation (Signed)
Anesthesia Post Note ? ?Patient: Michele Snyder ? ?Procedure(s) Performed: COLONOSCOPY WITH PROPOFOL ? ?Patient location during evaluation: Endoscopy ?Anesthesia Type: General ?Level of consciousness: awake and alert and oriented ?Pain management: pain level controlled ?Vital Signs Assessment: post-procedure vital signs reviewed and stable ?Respiratory status: spontaneous breathing, nonlabored ventilation and respiratory function stable ?Cardiovascular status: blood pressure returned to baseline and stable ?Postop Assessment: no apparent nausea or vomiting ?Anesthetic complications: no ? ? ?No notable events documented. ? ? ?Last Vitals:  ?Vitals:  ? 09/28/21 1138 09/28/21 1230  ?BP: 139/79 (!) 91/55  ?Pulse: 73   ?Resp: 19 20  ?Temp: 36.4 ?C 36.4 ?C  ?SpO2: 98% 99%  ?  ?Last Pain:  ?Vitals:  ? 09/28/21 1230  ?TempSrc: Oral  ?PainSc: 0-No pain  ? ? ?  ?  ?  ?  ?  ?  ? ?Yasemin Rabon C Lodema Parma ? ? ? ? ?

## 2021-09-28 NOTE — Discharge Instructions (Addendum)
?  Colonoscopy ?Discharge Instructions ? ?Read the instructions outlined below and refer to this sheet in the next few weeks. These discharge instructions provide you with general information on caring for yourself after you leave the hospital. Your doctor may also give you specific instructions. While your treatment has been planned according to the most current medical practices available, unavoidable complications occasionally occur.  ? ?ACTIVITY ?You may resume your regular activity, but move at a slower pace for the next 24 hours.  ?Take frequent rest periods for the next 24 hours.  ?Walking will help get rid of the air and reduce the bloated feeling in your belly (abdomen).  ?No driving for 24 hours (because of the medicine (anesthesia) used during the test).   ?Do not sign any important legal documents or operate any machinery for 24 hours (because of the anesthesia used during the test).  ?NUTRITION ?Drink plenty of fluids.  ?You may resume your normal diet as instructed by your doctor.  ?Begin with a light meal and progress to your normal diet. Heavy or fried foods are harder to digest and may make you feel sick to your stomach (nauseated).  ?Avoid alcoholic beverages for 24 hours or as instructed.  ?MEDICATIONS ?You may resume your normal medications unless your doctor tells you otherwise.  ?WHAT YOU CAN EXPECT TODAY ?Some feelings of bloating in the abdomen.  ?Passage of more gas than usual.  ?Spotting of blood in your stool or on the toilet paper.  ?IF YOU HAD POLYPS REMOVED DURING THE COLONOSCOPY: ?No aspirin products for 7 days or as instructed.  ?No alcohol for 7 days or as instructed.  ?Eat a soft diet for the next 24 hours.  ?FINDING OUT THE RESULTS OF YOUR TEST ?Not all test results are available during your visit. If your test results are not back during the visit, make an appointment with your caregiver to find out the results. Do not assume everything is normal if you have not heard from your  caregiver or the medical facility. It is important for you to follow up on all of your test results.  ?SEEK IMMEDIATE MEDICAL ATTENTION IF: ?You have more than a spotting of blood in your stool.  ?Your belly is swollen (abdominal distention).  ?You are nauseated or vomiting.  ?You have a temperature over 101.  ?You have abdominal pain or discomfort that is severe or gets worse throughout the day.  ? ?Your colonoscopy revealed 1 polyp(s) which I removed successfully. Await pathology results, my office will contact you. I recommend repeating colonoscopy in 5-10 years for surveillance purposes. Otherwise follow up with Gi in 6 months.  Call office Monday to schedule 6 month appointment [phone]). ? ? ?I hope you have a great rest of your week! ? ?Hennie Duos. Marletta Lor, D.O. ?Gastroenterology and Hepatology ?[facility] ? ?

## 2021-10-01 LAB — SURGICAL PATHOLOGY

## 2021-10-05 ENCOUNTER — Encounter (HOSPITAL_COMMUNITY): Payer: Self-pay | Admitting: Internal Medicine

## 2021-12-13 DIAGNOSIS — Z13220 Encounter for screening for lipoid disorders: Secondary | ICD-10-CM | POA: Diagnosis not present

## 2021-12-13 DIAGNOSIS — Z131 Encounter for screening for diabetes mellitus: Secondary | ICD-10-CM | POA: Diagnosis not present

## 2021-12-13 DIAGNOSIS — Z1329 Encounter for screening for other suspected endocrine disorder: Secondary | ICD-10-CM | POA: Diagnosis not present

## 2021-12-13 DIAGNOSIS — E559 Vitamin D deficiency, unspecified: Secondary | ICD-10-CM | POA: Diagnosis not present

## 2021-12-13 DIAGNOSIS — I10 Essential (primary) hypertension: Secondary | ICD-10-CM | POA: Diagnosis not present

## 2021-12-13 DIAGNOSIS — Z0000 Encounter for general adult medical examination without abnormal findings: Secondary | ICD-10-CM | POA: Diagnosis not present

## 2021-12-18 ENCOUNTER — Other Ambulatory Visit (HOSPITAL_COMMUNITY): Payer: Self-pay

## 2021-12-18 DIAGNOSIS — R030 Elevated blood-pressure reading, without diagnosis of hypertension: Secondary | ICD-10-CM | POA: Diagnosis not present

## 2021-12-18 DIAGNOSIS — Z6825 Body mass index (BMI) 25.0-25.9, adult: Secondary | ICD-10-CM | POA: Diagnosis not present

## 2021-12-18 DIAGNOSIS — Z0000 Encounter for general adult medical examination without abnormal findings: Secondary | ICD-10-CM | POA: Diagnosis not present

## 2021-12-18 DIAGNOSIS — R718 Other abnormality of red blood cells: Secondary | ICD-10-CM | POA: Diagnosis not present

## 2021-12-18 DIAGNOSIS — R7303 Prediabetes: Secondary | ICD-10-CM | POA: Diagnosis not present

## 2021-12-18 MED ORDER — METFORMIN HCL ER 500 MG PO TB24
500.0000 mg | ORAL_TABLET | Freq: Every day | ORAL | 0 refills | Status: DC
Start: 2021-12-18 — End: 2022-04-08
  Filled 2021-12-18: qty 90, 90d supply, fill #0

## 2022-01-17 DIAGNOSIS — Z23 Encounter for immunization: Secondary | ICD-10-CM | POA: Diagnosis not present

## 2022-02-14 ENCOUNTER — Other Ambulatory Visit: Payer: Self-pay | Admitting: Internal Medicine

## 2022-02-14 DIAGNOSIS — Z1231 Encounter for screening mammogram for malignant neoplasm of breast: Secondary | ICD-10-CM

## 2022-02-19 ENCOUNTER — Ambulatory Visit
Admission: RE | Admit: 2022-02-19 | Discharge: 2022-02-19 | Disposition: A | Discharge status: Home / Self Care | Payer: 59 | Source: Ambulatory Visit | Attending: Internal Medicine | Admitting: Internal Medicine

## 2022-02-19 DIAGNOSIS — Z1231 Encounter for screening mammogram for malignant neoplasm of breast: Secondary | ICD-10-CM | POA: Diagnosis not present

## 2022-03-08 ENCOUNTER — Ambulatory Visit: Payer: 59

## 2022-04-07 ENCOUNTER — Other Ambulatory Visit (HOSPITAL_COMMUNITY): Payer: Self-pay

## 2022-04-08 ENCOUNTER — Other Ambulatory Visit (HOSPITAL_COMMUNITY): Payer: Self-pay

## 2022-04-08 MED ORDER — METFORMIN HCL ER 500 MG PO TB24
500.0000 mg | ORAL_TABLET | Freq: Every day | ORAL | 0 refills | Status: AC
Start: 2022-04-08 — End: ?
  Filled 2022-04-08: qty 90, 90d supply, fill #0

## 2022-04-30 DIAGNOSIS — R030 Elevated blood-pressure reading, without diagnosis of hypertension: Secondary | ICD-10-CM | POA: Diagnosis not present

## 2022-04-30 DIAGNOSIS — Z6825 Body mass index (BMI) 25.0-25.9, adult: Secondary | ICD-10-CM | POA: Diagnosis not present

## 2022-04-30 DIAGNOSIS — R059 Cough, unspecified: Secondary | ICD-10-CM | POA: Diagnosis not present

## 2022-04-30 DIAGNOSIS — Z20828 Contact with and (suspected) exposure to other viral communicable diseases: Secondary | ICD-10-CM | POA: Diagnosis not present

## 2022-04-30 DIAGNOSIS — J019 Acute sinusitis, unspecified: Secondary | ICD-10-CM | POA: Diagnosis not present

## 2022-04-30 DIAGNOSIS — J209 Acute bronchitis, unspecified: Secondary | ICD-10-CM | POA: Diagnosis not present

## 2022-06-20 DIAGNOSIS — R7303 Prediabetes: Secondary | ICD-10-CM | POA: Diagnosis not present

## 2022-08-19 ENCOUNTER — Other Ambulatory Visit (HOSPITAL_COMMUNITY): Payer: Self-pay

## 2022-09-24 ENCOUNTER — Other Ambulatory Visit (HOSPITAL_BASED_OUTPATIENT_CLINIC_OR_DEPARTMENT_OTHER): Payer: Self-pay

## 2022-09-24 ENCOUNTER — Other Ambulatory Visit (HOSPITAL_COMMUNITY): Payer: Self-pay

## 2022-09-24 ENCOUNTER — Encounter (HOSPITAL_COMMUNITY): Payer: Self-pay

## 2022-09-24 MED ORDER — METFORMIN HCL ER 500 MG PO TB24
500.0000 mg | ORAL_TABLET | Freq: Every day | ORAL | 0 refills | Status: DC
  Filled 2022-09-24: qty 90, 90d supply, fill #0

## 2022-12-20 ENCOUNTER — Other Ambulatory Visit: Payer: Self-pay

## 2022-12-20 ENCOUNTER — Other Ambulatory Visit (HOSPITAL_COMMUNITY): Payer: Self-pay

## 2022-12-20 DIAGNOSIS — E559 Vitamin D deficiency, unspecified: Secondary | ICD-10-CM | POA: Diagnosis not present

## 2022-12-20 DIAGNOSIS — Z0001 Encounter for general adult medical examination with abnormal findings: Secondary | ICD-10-CM | POA: Diagnosis not present

## 2022-12-20 DIAGNOSIS — R7303 Prediabetes: Secondary | ICD-10-CM | POA: Diagnosis not present

## 2022-12-20 DIAGNOSIS — Z13220 Encounter for screening for lipoid disorders: Secondary | ICD-10-CM | POA: Diagnosis not present

## 2022-12-20 DIAGNOSIS — I10 Essential (primary) hypertension: Secondary | ICD-10-CM | POA: Diagnosis not present

## 2022-12-20 DIAGNOSIS — Z1329 Encounter for screening for other suspected endocrine disorder: Secondary | ICD-10-CM | POA: Diagnosis not present

## 2022-12-26 ENCOUNTER — Other Ambulatory Visit (HOSPITAL_COMMUNITY): Payer: Self-pay

## 2022-12-26 MED ORDER — METFORMIN HCL ER 500 MG PO TB24
500.0000 mg | ORAL_TABLET | Freq: Every day | ORAL | 0 refills | Status: DC
  Filled 2022-12-26: qty 90, 90d supply, fill #0

## 2022-12-27 ENCOUNTER — Other Ambulatory Visit (HOSPITAL_COMMUNITY): Payer: Self-pay

## 2023-01-02 ENCOUNTER — Other Ambulatory Visit (HOSPITAL_COMMUNITY): Payer: Self-pay

## 2023-01-02 DIAGNOSIS — R7303 Prediabetes: Secondary | ICD-10-CM | POA: Diagnosis not present

## 2023-01-02 DIAGNOSIS — Z23 Encounter for immunization: Secondary | ICD-10-CM | POA: Diagnosis not present

## 2023-01-02 DIAGNOSIS — Z6825 Body mass index (BMI) 25.0-25.9, adult: Secondary | ICD-10-CM | POA: Diagnosis not present

## 2023-01-02 DIAGNOSIS — R030 Elevated blood-pressure reading, without diagnosis of hypertension: Secondary | ICD-10-CM | POA: Diagnosis not present

## 2023-01-02 DIAGNOSIS — R718 Other abnormality of red blood cells: Secondary | ICD-10-CM | POA: Diagnosis not present

## 2023-01-02 DIAGNOSIS — Z0001 Encounter for general adult medical examination with abnormal findings: Secondary | ICD-10-CM | POA: Diagnosis not present

## 2023-01-02 MED ORDER — METFORMIN HCL ER 500 MG PO TB24
500.0000 mg | ORAL_TABLET | Freq: Two times a day (BID) | ORAL | 3 refills | Status: DC
  Filled 2023-01-07 – 2023-02-12 (×3): qty 180, 90d supply, fill #0
  Filled 2023-03-26: qty 180, 90d supply, fill #1
  Filled 2023-03-31: qty 60, 30d supply, fill #1
  Filled 2023-05-05: qty 60, 30d supply, fill #2
  Filled 2023-07-22 – 2023-07-23 (×2): qty 60, 30d supply, fill #3
  Filled 2023-08-20: qty 60, 30d supply, fill #4
  Filled 2023-09-29: qty 60, 30d supply, fill #5
  Filled 2023-10-27: qty 60, 30d supply, fill #6
  Filled 2023-12-01: qty 60, 30d supply, fill #7
  Filled 2023-12-29: qty 60, 30d supply, fill #8

## 2023-01-07 ENCOUNTER — Other Ambulatory Visit (HOSPITAL_COMMUNITY): Payer: Self-pay

## 2023-01-23 ENCOUNTER — Other Ambulatory Visit (HOSPITAL_COMMUNITY): Payer: Self-pay

## 2023-01-23 ENCOUNTER — Other Ambulatory Visit: Payer: Self-pay | Admitting: *Deleted

## 2023-01-23 DIAGNOSIS — Z1231 Encounter for screening mammogram for malignant neoplasm of breast: Secondary | ICD-10-CM

## 2023-02-12 ENCOUNTER — Other Ambulatory Visit (HOSPITAL_COMMUNITY): Payer: Self-pay

## 2023-02-21 ENCOUNTER — Ambulatory Visit: Payer: Commercial Managed Care - PPO

## 2023-02-27 ENCOUNTER — Ambulatory Visit
Admission: RE | Admit: 2023-02-27 | Discharge: 2023-02-27 | Disposition: A | Discharge status: Home / Self Care | Payer: Commercial Managed Care - PPO | Source: Ambulatory Visit | Attending: Internal Medicine | Admitting: Internal Medicine

## 2023-02-27 DIAGNOSIS — Z1231 Encounter for screening mammogram for malignant neoplasm of breast: Secondary | ICD-10-CM

## 2023-03-04 ENCOUNTER — Other Ambulatory Visit: Payer: Self-pay | Admitting: Internal Medicine

## 2023-03-04 DIAGNOSIS — R928 Other abnormal and inconclusive findings on diagnostic imaging of breast: Secondary | ICD-10-CM

## 2023-03-17 ENCOUNTER — Ambulatory Visit
Admission: RE | Admit: 2023-03-17 | Discharge: 2023-03-17 | Disposition: A | Discharge status: Home / Self Care | Payer: Commercial Managed Care - PPO | Source: Ambulatory Visit | Attending: Internal Medicine | Admitting: Internal Medicine

## 2023-03-17 DIAGNOSIS — R599 Enlarged lymph nodes, unspecified: Secondary | ICD-10-CM | POA: Diagnosis not present

## 2023-03-17 DIAGNOSIS — R928 Other abnormal and inconclusive findings on diagnostic imaging of breast: Secondary | ICD-10-CM

## 2023-03-26 ENCOUNTER — Other Ambulatory Visit (HOSPITAL_COMMUNITY): Payer: Self-pay

## 2023-03-26 ENCOUNTER — Other Ambulatory Visit: Payer: Self-pay

## 2023-03-31 ENCOUNTER — Other Ambulatory Visit (HOSPITAL_COMMUNITY): Payer: Self-pay

## 2023-03-31 ENCOUNTER — Other Ambulatory Visit: Payer: Self-pay

## 2023-05-05 ENCOUNTER — Other Ambulatory Visit (HOSPITAL_BASED_OUTPATIENT_CLINIC_OR_DEPARTMENT_OTHER): Payer: Self-pay

## 2023-05-05 ENCOUNTER — Other Ambulatory Visit (HOSPITAL_COMMUNITY): Payer: Self-pay

## 2023-05-09 IMAGING — CT CT CARDIAC CORONARY ARTERY CALCIUM SCORE
1 of 2 series · 10 of 20 positions shown, 13 images · non-contrast
Comparison: None.

Addendum:
CLINICAL DATA: Cardiovascular disease risk stratification

EXAM:
CT Coronary Calcium Score
TECHNIQUE: A gated, non-contrast computed tomography scan of the heart was
performed using 3mm slice thickness. Axial images were analyzed on a
dedicated workstation. Calcium scoring of the coronary arteries was
performed using the Agatston method.

[Series 3: ax st · axial · 0.81mm/px · z∈[+1270,+1394]mm · 10 of 76 slices shown, 13 images]
[im 7/76  vessel]
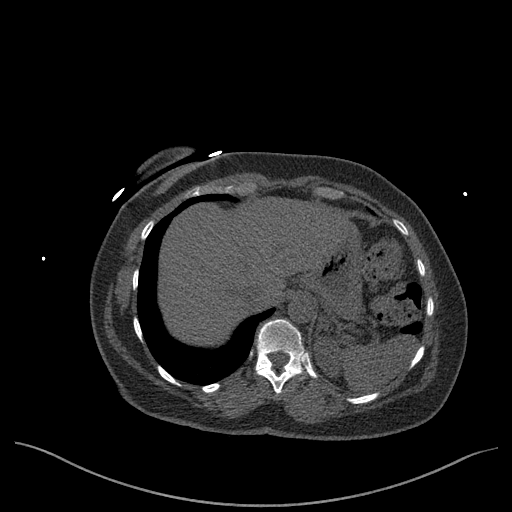
[im 7/76  lung]
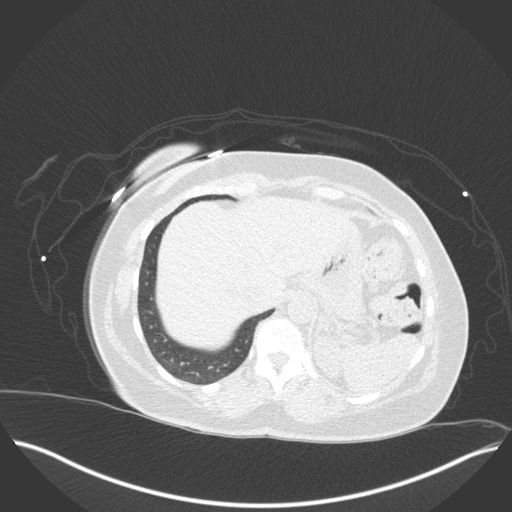
[im 14/76  vessel]
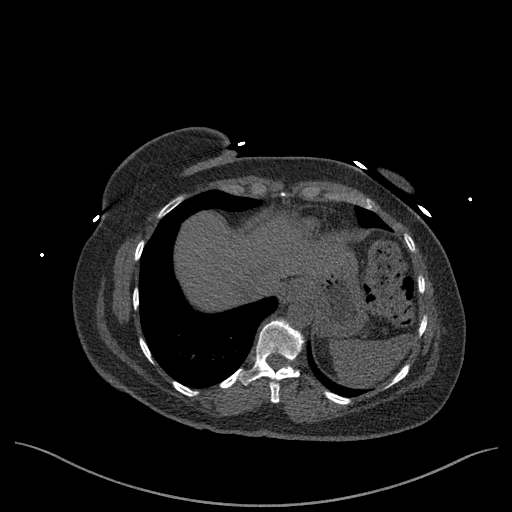
[im 21/76  vessel]
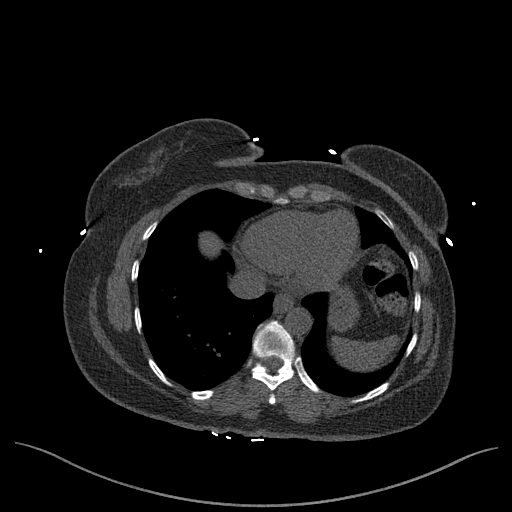
[im 28/76  vessel]
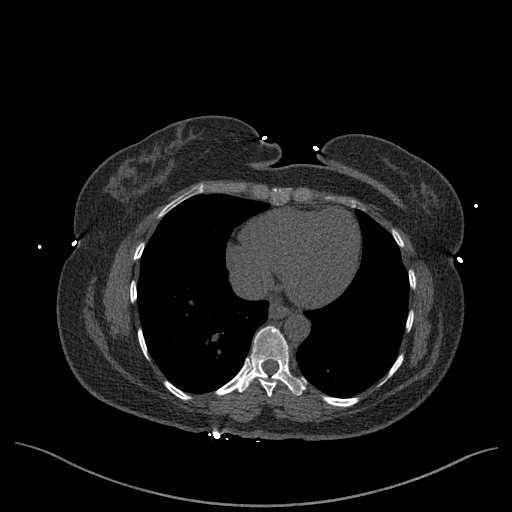
[im 35/76  vessel]
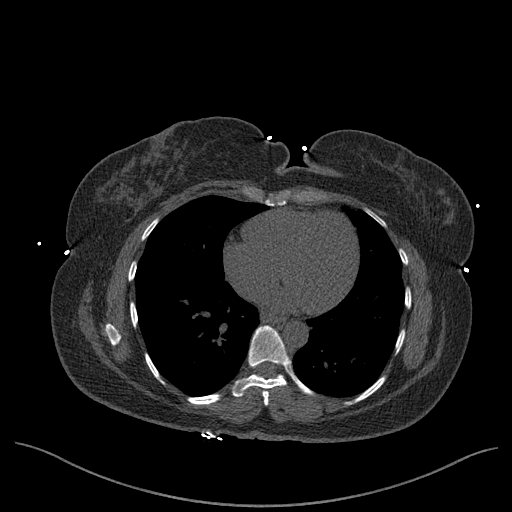
[im 35/76  lung]
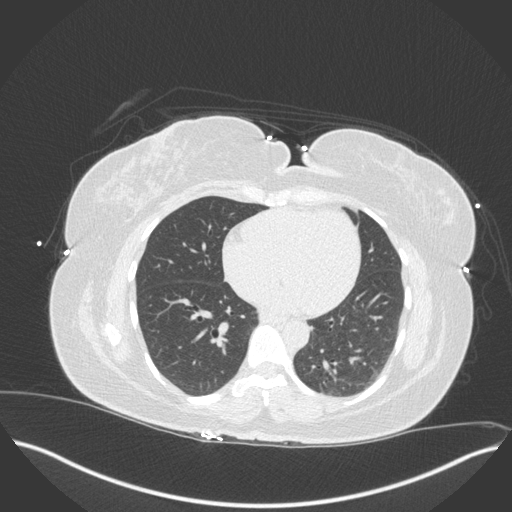
[im 41/76  vessel]
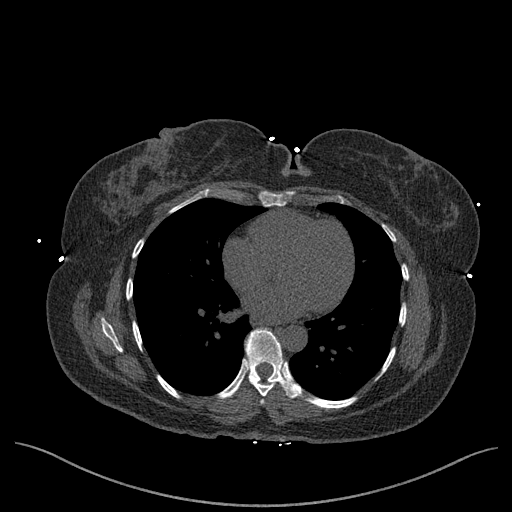
[im 48/76  vessel]
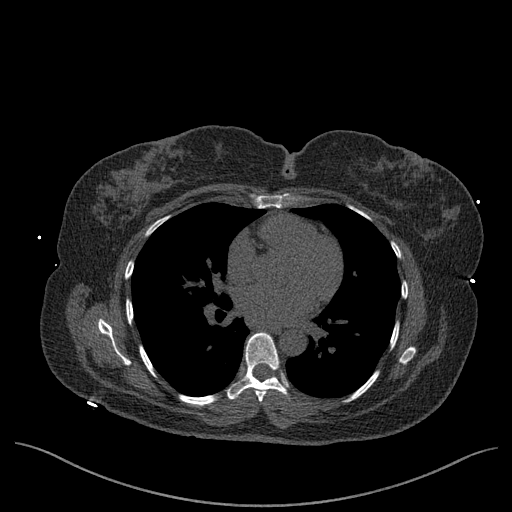
[im 55/76  vessel]
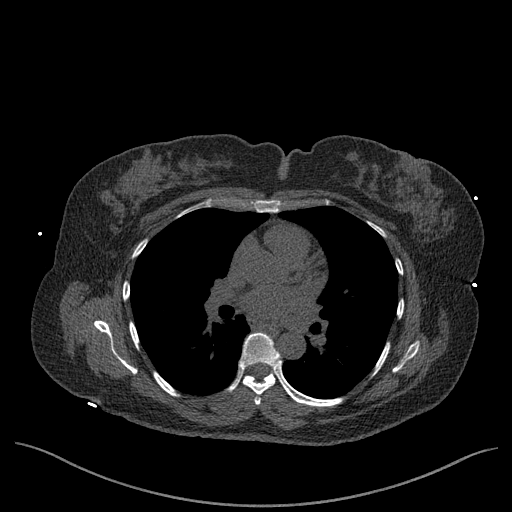
[im 62/76  vessel]
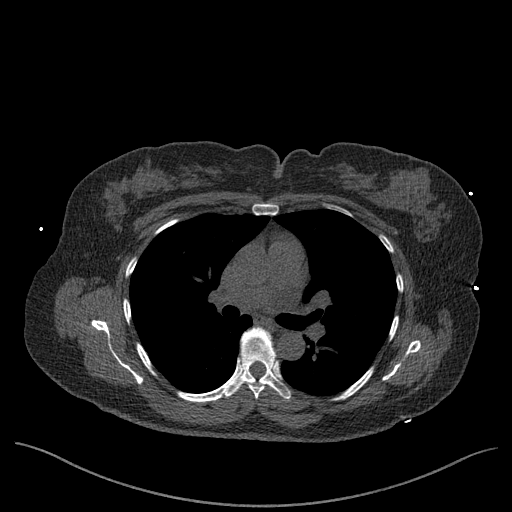
[im 62/76  lung]
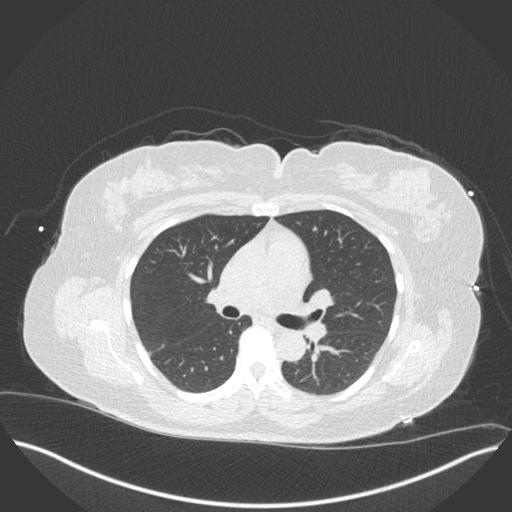
[im 69/76  vessel]
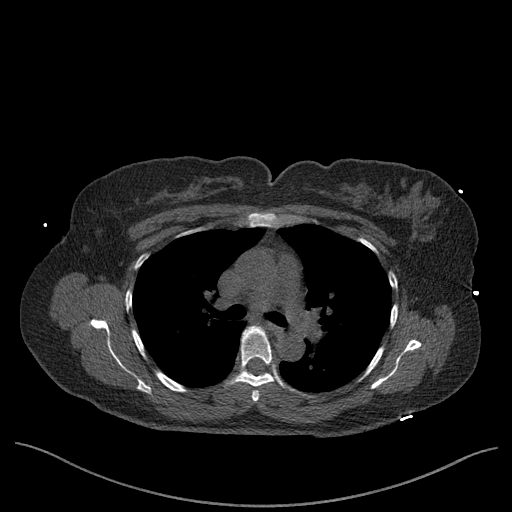

[10 of 20 positions shown; findings below may reference images not displayed]

FINDINGS: Coronary arteries: Normal origins.

Coronary Calcium Score:

Left main: 0

Left anterior descending artery: 0

Left circumflex artery: 0

Right coronary artery: 0

Total: 0

Pericardium: Normal.

Ascending Aorta: Normal caliber. Ascending aorta measures
approximately 32mm at the mid ascending aorta measured in an axial
plane.

Non-cardiac: See separate report from [REDACTED].
IMPRESSION: Coronary calcium score of 0.



If CAC=0, it is reasonable to withhold statin therapy and reassess
in 5 to 10 years, as long as higher risk conditions are absent
(diabetes mellitus, family history of premature CHD in first degree
relatives (males <55 years; females <65 years), cigarette smoking,
or LDL >=190 mg/dL).

If CAC is 1 to 99, it is reasonable to initiate statin therapy for
patients >=55 years of age.

If CAC is >=100 or >=75th percentile, it is reasonable to initiate
statin therapy at any age.

Cardiology referral should be considered for patients with CAC
scores >=400 or >=75th percentile.

*1232 AHA/ACC/AACVPR/AAPA/ABC/JOWINAENAREND/TENK/LUI/[place]/LAVANDO/MAROZS/LOCKLEAR
Guideline on the Management of Blood Cholesterol: A Report of the
American College of Cardiology/American Heart Association Task Force
on Clinical Practice Guidelines. J Am Coll Cardiol.
2279;73(24):7441-7081.

EXAM:
OVER-READ INTERPRETATION  CT CHEST

The following report is an over-read performed by radiologist Dr.
does not include interpretation of cardiac or coronary anatomy or
pathology. The coronary calcium score interpretation by the
cardiologist is attached.
FINDINGS: Heart size is normal. Visualized mediastinal structures are normal.
Images of the upper abdomen are unremarkable. Few densities in the
left lower lobe are most compatible with atelectasis. No large
pleural effusions. No significant airspace disease or consolidation
in the visualized lungs. No acute bone abnormality.
IMPRESSION: No acute abnormality involving the extracardiac structures.

*** End of Addendum ***
FINDINGS: Coronary arteries: Normal origins.

Coronary Calcium Score:

Left main: 0

Left anterior descending artery: 0

Left circumflex artery: 0

Right coronary artery: 0

Total: 0

Pericardium: Normal.

Ascending Aorta: Normal caliber. Ascending aorta measures
approximately 32mm at the mid ascending aorta measured in an axial
plane.

Non-cardiac: See separate report from [REDACTED].
IMPRESSION: Coronary calcium score of 0.



If CAC=0, it is reasonable to withhold statin therapy and reassess
in 5 to 10 years, as long as higher risk conditions are absent
(diabetes mellitus, family history of premature CHD in first degree
relatives (males <55 years; females <65 years), cigarette smoking,
or LDL >=190 mg/dL).

If CAC is 1 to 99, it is reasonable to initiate statin therapy for
patients >=55 years of age.

If CAC is >=100 or >=75th percentile, it is reasonable to initiate
statin therapy at any age.

Cardiology referral should be considered for patients with CAC
scores >=400 or >=75th percentile.

*1232 AHA/ACC/AACVPR/AAPA/ABC/JOWINAENAREND/TENK/LUI/[place]/LAVANDO/MAROZS/LOCKLEAR
Guideline on the Management of Blood Cholesterol: A Report of the
American College of Cardiology/American Heart Association Task Force
on Clinical Practice Guidelines. J Am Coll Cardiol.
2279;73(24):7441-7081.

## 2023-07-14 DIAGNOSIS — D649 Anemia, unspecified: Secondary | ICD-10-CM | POA: Diagnosis not present

## 2023-07-14 DIAGNOSIS — D519 Vitamin B12 deficiency anemia, unspecified: Secondary | ICD-10-CM | POA: Diagnosis not present

## 2023-07-14 DIAGNOSIS — D509 Iron deficiency anemia, unspecified: Secondary | ICD-10-CM | POA: Diagnosis not present

## 2023-07-14 DIAGNOSIS — D529 Folate deficiency anemia, unspecified: Secondary | ICD-10-CM | POA: Diagnosis not present

## 2023-07-16 DIAGNOSIS — D529 Folate deficiency anemia, unspecified: Secondary | ICD-10-CM | POA: Diagnosis not present

## 2023-07-16 DIAGNOSIS — D519 Vitamin B12 deficiency anemia, unspecified: Secondary | ICD-10-CM | POA: Diagnosis not present

## 2023-07-16 DIAGNOSIS — D649 Anemia, unspecified: Secondary | ICD-10-CM | POA: Diagnosis not present

## 2023-07-16 DIAGNOSIS — D509 Iron deficiency anemia, unspecified: Secondary | ICD-10-CM | POA: Diagnosis not present

## 2023-07-23 ENCOUNTER — Other Ambulatory Visit (HOSPITAL_COMMUNITY): Payer: Self-pay

## 2023-08-20 ENCOUNTER — Other Ambulatory Visit (HOSPITAL_COMMUNITY): Payer: Self-pay

## 2023-09-20 LAB — AMB RESULTS CONSOLE CBG: Glucose: 90

## 2023-09-23 NOTE — Progress Notes (Signed)
 Pt came to mobile screening at [place]. Michele Snyder in [place]. Blood pressure elevated. Discussed with patient lowering BP. NO SDOH accessed.

## 2023-09-29 ENCOUNTER — Other Ambulatory Visit (HOSPITAL_COMMUNITY): Payer: Self-pay

## 2023-10-28 ENCOUNTER — Other Ambulatory Visit (HOSPITAL_COMMUNITY): Payer: Self-pay

## 2023-12-01 ENCOUNTER — Other Ambulatory Visit (HOSPITAL_COMMUNITY): Payer: Self-pay

## 2023-12-30 ENCOUNTER — Other Ambulatory Visit (HOSPITAL_COMMUNITY): Payer: Self-pay

## 2024-01-01 NOTE — Progress Notes (Signed)
 The patient attended a screening event on 09/20/2023 where her B/P screening results was 168/94, non-fasting blood glucose 90. At the event the patient noted she has private insurance and does not smoke. Patient did not indicated any SDOH insecurities at the event. Pt list pcp as Dr. Vicci. At the event pt was instructed to lower B/P by clinician. Per chart review pt pcp is Michele Snyder at [facility] and the last office visit was on 07/16/2023. According to review pt is currently on metFormin  to manage diabetes. Post event initial f/u [facility] called pt pcp office to confirm that pt pcp is Dr. [name]. And pt was last seen 12/30/2023 for lab work, pt has a future appt with pcp on 01/06/2024. Abnormal courtesy letter sent with blood pressure resources in case needed by pt. No additional Health [employer] team support indicated at this time.

## 2024-01-29 ENCOUNTER — Other Ambulatory Visit (HOSPITAL_COMMUNITY): Payer: Self-pay

## 2024-01-29 ENCOUNTER — Encounter: Payer: Self-pay | Admitting: Pharmacist

## 2024-01-29 ENCOUNTER — Other Ambulatory Visit: Payer: Self-pay

## 2024-01-29 MED ORDER — METFORMIN HCL ER 500 MG PO TB24
500.0000 mg | ORAL_TABLET | Freq: Two times a day (BID) | ORAL | 3 refills | Status: AC
  Filled 2024-01-29 – 2024-02-05 (×2): qty 180, 90d supply, fill #0
  Filled 2024-05-25: qty 180, 90d supply, fill #1

## 2024-02-03 ENCOUNTER — Other Ambulatory Visit: Payer: Self-pay

## 2024-02-05 ENCOUNTER — Other Ambulatory Visit (HOSPITAL_COMMUNITY): Payer: Self-pay

## 2024-02-05 ENCOUNTER — Other Ambulatory Visit: Payer: Self-pay

## 2024-03-02 DIAGNOSIS — Z23 Encounter for immunization: Secondary | ICD-10-CM | POA: Diagnosis not present

## 2024-03-23 ENCOUNTER — Other Ambulatory Visit: Payer: Self-pay | Admitting: Internal Medicine

## 2024-03-23 DIAGNOSIS — Z1231 Encounter for screening mammogram for malignant neoplasm of breast: Secondary | ICD-10-CM

## 2024-04-13 ENCOUNTER — Ambulatory Visit
Admission: RE | Admit: 2024-04-13 | Discharge: 2024-04-13 | Disposition: A | Discharge status: Home / Self Care | Source: Ambulatory Visit | Attending: Internal Medicine | Admitting: Internal Medicine

## 2024-04-13 DIAGNOSIS — Z1231 Encounter for screening mammogram for malignant neoplasm of breast: Secondary | ICD-10-CM

## 2024-05-25 ENCOUNTER — Other Ambulatory Visit (HOSPITAL_COMMUNITY): Payer: Self-pay
